# Patient Record
Sex: Male | Born: 1970 | Race: White | Hispanic: No | Marital: Single | Smoking: Current every day smoker
Health system: Southern US, Community
[De-identification: ages and names within clinical notes are randomized; demographics above are authoritative.]

## PROBLEM LIST (undated history)

## (undated) DIAGNOSIS — I10 Essential (primary) hypertension: Secondary | ICD-10-CM

## (undated) DIAGNOSIS — K219 Gastro-esophageal reflux disease without esophagitis: Secondary | ICD-10-CM

## (undated) DIAGNOSIS — R51 Headache: Secondary | ICD-10-CM

## (undated) DIAGNOSIS — F419 Anxiety disorder, unspecified: Secondary | ICD-10-CM

## (undated) DIAGNOSIS — M1990 Unspecified osteoarthritis, unspecified site: Secondary | ICD-10-CM

## (undated) DIAGNOSIS — J189 Pneumonia, unspecified organism: Secondary | ICD-10-CM

## (undated) HISTORY — PX: HERNIA REPAIR: SHX51

---

## 1999-09-25 ENCOUNTER — Emergency Department (HOSPITAL_COMMUNITY): Admission: EM | Admit: 1999-09-25 | Discharge: 1999-09-25 | Payer: Self-pay | Admitting: Emergency Medicine

## 1999-10-17 ENCOUNTER — Ambulatory Visit (HOSPITAL_COMMUNITY): Admission: RE | Admit: 1999-10-17 | Discharge: 1999-10-17 | Payer: Self-pay | Admitting: General Surgery

## 2000-08-20 ENCOUNTER — Emergency Department (HOSPITAL_COMMUNITY): Admission: EM | Admit: 2000-08-20 | Discharge: 2000-08-20 | Payer: Self-pay | Admitting: Emergency Medicine

## 2007-08-09 ENCOUNTER — Emergency Department (HOSPITAL_COMMUNITY): Admission: EM | Admit: 2007-08-09 | Discharge: 2007-08-09 | Payer: Self-pay | Admitting: Emergency Medicine

## 2009-08-17 ENCOUNTER — Emergency Department (HOSPITAL_BASED_OUTPATIENT_CLINIC_OR_DEPARTMENT_OTHER): Admission: EM | Admit: 2009-08-17 | Discharge: 2009-08-17 | Payer: Self-pay | Admitting: Emergency Medicine

## 2009-08-29 ENCOUNTER — Emergency Department (HOSPITAL_COMMUNITY): Admission: EM | Admit: 2009-08-29 | Discharge: 2009-08-29 | Payer: Self-pay | Admitting: Emergency Medicine

## 2010-09-18 ENCOUNTER — Emergency Department (HOSPITAL_COMMUNITY)
Admission: EM | Admit: 2010-09-18 | Discharge: 2010-09-18 | Disposition: A | Discharge status: Home / Self Care | Payer: Self-pay | Attending: Emergency Medicine | Admitting: Emergency Medicine

## 2010-09-18 ENCOUNTER — Emergency Department (HOSPITAL_COMMUNITY): Payer: Self-pay

## 2010-09-18 DIAGNOSIS — M25519 Pain in unspecified shoulder: Secondary | ICD-10-CM | POA: Insufficient documentation

## 2010-10-05 ENCOUNTER — Emergency Department (HOSPITAL_COMMUNITY)
Admission: EM | Admit: 2010-10-05 | Discharge: 2010-10-05 | Disposition: A | Discharge status: Home / Self Care | Payer: Self-pay | Attending: Emergency Medicine | Admitting: Emergency Medicine

## 2010-10-05 DIAGNOSIS — K029 Dental caries, unspecified: Secondary | ICD-10-CM | POA: Insufficient documentation

## 2010-10-05 DIAGNOSIS — K089 Disorder of teeth and supporting structures, unspecified: Secondary | ICD-10-CM | POA: Insufficient documentation

## 2010-10-05 DIAGNOSIS — K047 Periapical abscess without sinus: Secondary | ICD-10-CM | POA: Insufficient documentation

## 2010-10-15 ENCOUNTER — Emergency Department (HOSPITAL_COMMUNITY)
Admission: EM | Admit: 2010-10-15 | Discharge: 2010-10-15 | Disposition: A | Discharge status: Home / Self Care | Payer: No Typology Code available for payment source | Attending: Emergency Medicine | Admitting: Emergency Medicine

## 2010-10-15 ENCOUNTER — Emergency Department (HOSPITAL_COMMUNITY): Payer: No Typology Code available for payment source

## 2010-10-15 ENCOUNTER — Emergency Department (HOSPITAL_COMMUNITY)
Admission: EM | Admit: 2010-10-15 | Discharge: 2010-10-15 | Discharge status: Against Medical Advice | Payer: No Typology Code available for payment source | Attending: Emergency Medicine | Admitting: Emergency Medicine

## 2010-10-15 DIAGNOSIS — IMO0002 Reserved for concepts with insufficient information to code with codable children: Secondary | ICD-10-CM | POA: Insufficient documentation

## 2010-10-15 DIAGNOSIS — S0993XA Unspecified injury of face, initial encounter: Secondary | ICD-10-CM | POA: Insufficient documentation

## 2010-10-15 DIAGNOSIS — Y92410 Unspecified street and highway as the place of occurrence of the external cause: Secondary | ICD-10-CM | POA: Insufficient documentation

## 2010-10-15 DIAGNOSIS — S4980XA Other specified injuries of shoulder and upper arm, unspecified arm, initial encounter: Secondary | ICD-10-CM | POA: Insufficient documentation

## 2010-10-15 DIAGNOSIS — M25519 Pain in unspecified shoulder: Secondary | ICD-10-CM | POA: Insufficient documentation

## 2010-10-15 DIAGNOSIS — S46909A Unspecified injury of unspecified muscle, fascia and tendon at shoulder and upper arm level, unspecified arm, initial encounter: Secondary | ICD-10-CM | POA: Insufficient documentation

## 2010-10-15 DIAGNOSIS — S199XXA Unspecified injury of neck, initial encounter: Secondary | ICD-10-CM | POA: Insufficient documentation

## 2010-10-15 DIAGNOSIS — M542 Cervicalgia: Secondary | ICD-10-CM | POA: Insufficient documentation

## 2011-04-25 ENCOUNTER — Encounter (HOSPITAL_COMMUNITY): Payer: Self-pay | Admitting: Pharmacy Technician

## 2011-04-27 ENCOUNTER — Encounter (HOSPITAL_COMMUNITY): Payer: Self-pay

## 2011-04-27 ENCOUNTER — Encounter (HOSPITAL_COMMUNITY)
Admission: RE | Admit: 2011-04-27 | Discharge: 2011-04-27 | Disposition: A | Discharge status: Home / Self Care | Payer: Medicaid Other | Source: Ambulatory Visit | Attending: Oral Surgery | Admitting: Oral Surgery

## 2011-04-27 HISTORY — DX: Unspecified osteoarthritis, unspecified site: M19.90

## 2011-04-27 HISTORY — DX: Essential (primary) hypertension: I10

## 2011-04-27 HISTORY — DX: Anxiety disorder, unspecified: F41.9

## 2011-04-27 HISTORY — DX: Pneumonia, unspecified organism: J18.9

## 2011-04-27 HISTORY — DX: Headache: R51

## 2011-04-27 HISTORY — DX: Gastro-esophageal reflux disease without esophagitis: K21.9

## 2011-04-27 LAB — BASIC METABOLIC PANEL
GFR calc Af Amer: >90 mL/min (ref >90)
GFR calc non Af Amer: >90 mL/min (ref >90)
Glucose, Bld: 119 mg/dL — ABNORMAL HIGH (ref 70–99)
Potassium: 4.7 mEq/L (ref 3.5–5.1)
Sodium: 142 mEq/L (ref 135–145)

## 2011-04-27 LAB — CBC
Hemoglobin: 14.5 g/dL (ref 13.0–17.0)
MCH: 31.9 pg (ref 26.0–34.0)
RBC: 4.55 MIL/uL (ref 4.22–5.81)
WBC: 8.4 10*3/uL (ref 4.0–10.5)

## 2011-04-27 NOTE — Pre-Procedure Instructions (Signed)
20 Eddie Simpson  04/27/2011   Your procedure is scheduled on:  05/11/2011, Friday  Report to [facility] at 6:45a.m.  Call this number if you have problems the morning of surgery: [phone]   Remember:   Do not eat food:After Midnight.  Do not drink clear liquids: 4 Hours before arrival.  Take these medicines the morning of surgery with A SIP OF WATER: xanax- if desired   Do not wear jewelry, make-up or nail polish.  Do not wear lotions, powders, or perfumes. You may wear deodorant.  Do not shave 48 hours prior to surgery.  Do not bring valuables to the hospital.  Contacts, dentures or bridgework may not be worn into surgery.  Leave suitcase in the car. After surgery it may be brought to your room.  For patients admitted to the hospital, checkout time is 11:00 AM the day of discharge.   Patients discharged the day of surgery will not be allowed to drive home.  Name and phone number of your driver:Elena Lorin Picket, ph: [phone]  Special Instructions: CHG Shower Use Special Wash: 1/2 bottle night before surgery and 1/2 bottle morning of surgery.   Please read over the following fact sheets that you were given: Pain Booklet, Coughing and Deep Breathing, MRSA Information and Surgical Site Infection Prevention

## 2011-05-10 NOTE — H&P (Signed)
Eddie Simpson is a 40 y.o. male patient with [id number] impacted wisdom teeth.  No diagnosis found.  Past Medical History  Diagnosis Date  . Hypertension     no Rx yet, but pt. remarks that his BP is sometimes ^   . Pneumonia     as a child  . GERD (gastroesophageal reflux disease)     rare use of acid reflux  . Headache     been told that he has headaches relative to  sinus problem relative to teeth  malposition    . 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   . Anxiety     seen by Dr. Tiburcio Pea, Psych., started xanax for anxiety, was also given Cymbalta & he didn't like the drug's effect     No current facility-administered medications for this encounter.   Current Outpatient Prescriptions  Medication Sig Dispense Refill  . ALPRAZolam (XANAX) 1 MG tablet Take 1 mg by mouth at bedtime as needed. For anxiety or sleep       . Aspirin-Salicylamide-Caffeine (BC HEADACHE POWDER PO) Take 1 Package by mouth daily as needed. For headache        . Chlorpheniramine-PSE-Ibuprofen (ADVIL ALLERGY SINUS) 2-30-200 MG TABS Take 1 tablet by mouth daily as needed. For allergy or sinus congestion       . diphenhydramine-acetaminophen (TYLENOL PM) 25-500 MG TABS Take 1 tablet by mouth at bedtime as needed. For sleep       . pseudoephedrine-acetaminophen (TYLENOL SINUS) 30-500 MG TABS Take 1 tablet by mouth every 4 (four) hours as needed. For allergies        No Known Allergies Active Problems:  * No active hospital problems. *   Vitals: There were no vitals taken for this visit. Lab results:No results found for this or any previous visit (from the past 24 hour(s)). Radiology Results: No results found. General appearance: alert and no distress Head: atraumatic Eyes: conjunctivae/corneas clear. PERRL, EOM's intact. Fundi benign. Ears: normal TM's and external ear canals both ears Nose: Nares normal. Septum midline. Mucosa normal. No drainage or sinus tenderness. Throat: abnormal  findings: Impacted teeth #'s 1, 16, 17, and 32 and Nonrestorable teeth #'s 18 and 31 Neck: no adenopathy and supple, symmetrical, trachea midline Resp: clear to auscultation bilaterally Cardio: regular rate and rhythm, S1, S2 normal, no murmur, click, rub or gallop GI: soft, non-tender; bowel sounds normal; no masses,  no organomegaly Extremities: extremities normal, atraumatic, no cyanosis or edema Neurologic: Alert and oriented X 3, normal strength and tone. Normal symmetric reflexes. Normal coordination and gait  Assessment:40 yo WM with impacted/nonrestorable teeth #'s 1, 16, 17, 18, 31, and 32.  Plan: Extract teeth 3's 1, 16, 17, 18, 31, 32. General anesthesia. Same day surgery   Glen Blatchley M 05/10/2011

## 2011-05-11 ENCOUNTER — Encounter (HOSPITAL_COMMUNITY)
Admission: RE | Disposition: A | Discharge status: Home / Self Care | Payer: Self-pay | Source: Ambulatory Visit | Attending: Oral Surgery

## 2011-05-11 ENCOUNTER — Encounter (HOSPITAL_COMMUNITY): Payer: Self-pay | Admitting: Anesthesiology

## 2011-05-11 ENCOUNTER — Ambulatory Visit (HOSPITAL_COMMUNITY): Payer: Medicaid Other | Admitting: Anesthesiology

## 2011-05-11 ENCOUNTER — Ambulatory Visit (HOSPITAL_COMMUNITY)
Admission: RE | Admit: 2011-05-11 | Discharge: 2011-05-11 | Disposition: A | Discharge status: Home / Self Care | Payer: Medicaid Other | Source: Ambulatory Visit | Attending: Oral Surgery | Admitting: Oral Surgery

## 2011-05-11 DIAGNOSIS — K006 Disturbances in tooth eruption: Secondary | ICD-10-CM | POA: Insufficient documentation

## 2011-05-11 DIAGNOSIS — Z01812 Encounter for preprocedural laboratory examination: Secondary | ICD-10-CM | POA: Insufficient documentation

## 2011-05-11 DIAGNOSIS — I10 Essential (primary) hypertension: Secondary | ICD-10-CM | POA: Insufficient documentation

## 2011-05-11 DIAGNOSIS — K219 Gastro-esophageal reflux disease without esophagitis: Secondary | ICD-10-CM | POA: Insufficient documentation

## 2011-05-11 DIAGNOSIS — F17200 Nicotine dependence, unspecified, uncomplicated: Secondary | ICD-10-CM | POA: Insufficient documentation

## 2011-05-11 DIAGNOSIS — K029 Dental caries, unspecified: Secondary | ICD-10-CM | POA: Insufficient documentation

## 2011-05-11 DIAGNOSIS — K011 Impacted teeth: Secondary | ICD-10-CM

## 2011-05-11 HISTORY — PX: TOOTH EXTRACTION: SHX859

## 2011-05-11 SURGERY — DENTAL RESTORATION/EXTRACTIONS
Anesthesia: General | Site: Mouth | Wound class: Clean Contaminated

## 2011-05-11 MED ORDER — SODIUM CHLORIDE 0.9 % IR SOLN
Status: DC | PRN
  Administered 2011-05-11: 1

## 2011-05-11 MED ORDER — GLYCOPYRROLATE 0.2 MG/ML IJ SOLN
INTRAMUSCULAR | Status: DC | PRN
  Administered 2011-05-11: .8 mg via INTRAVENOUS

## 2011-05-11 MED ORDER — SODIUM CHLORIDE 0.9 % EX SOLN: CUTANEOUS | Status: DC

## 2011-05-11 MED ORDER — NEOSTIGMINE METHYLSULFATE 1 MG/ML IJ SOLN
INTRAMUSCULAR | Status: DC | PRN
  Administered 2011-05-11: 5 mg via INTRAVENOUS

## 2011-05-11 MED ORDER — FENTANYL CITRATE 0.05 MG/ML IJ SOLN
INTRAMUSCULAR | Status: DC | PRN
  Administered 2011-05-11: 50 ug via INTRAVENOUS
  Administered 2011-05-11: 100 ug via INTRAVENOUS
  Administered 2011-05-11: 50 ug via INTRAVENOUS

## 2011-05-11 MED ORDER — HYDROMORPHONE HCL PF 1 MG/ML IJ SOLN
0.2500 mg | INTRAMUSCULAR | Status: DC | PRN
  Administered 2011-05-11 (×2): 0.5 mg via INTRAVENOUS

## 2011-05-11 MED ORDER — OXYCODONE-ACETAMINOPHEN 10-325 MG PO TABS: 1.0000 | ORAL_TABLET | ORAL | Status: AC | PRN

## 2011-05-11 MED ORDER — CEFAZOLIN SODIUM 1-5 GM-% IV SOLN
INTRAVENOUS | Status: DC | PRN
  Administered 2011-05-11: 1 g via INTRAVENOUS

## 2011-05-11 MED ORDER — LORAZEPAM 2 MG/ML IJ SOLN: 1.0000 mg | Freq: Once | INTRAMUSCULAR | Status: DC | PRN

## 2011-05-11 MED ORDER — PROPOFOL 10 MG/ML IV EMUL
INTRAVENOUS | Status: DC | PRN
  Administered 2011-05-11: 280 mg via INTRAVENOUS

## 2011-05-11 MED ORDER — CEFAZOLIN SODIUM 1-5 GM-% IV SOLN
INTRAVENOUS | Status: AC
  Filled 2011-05-11: qty 50

## 2011-05-11 MED ORDER — LIDOCAINE-EPINEPHRINE 2 %-1:100000 IJ SOLN
INTRAMUSCULAR | Status: DC | PRN
  Administered 2011-05-11: 10 mL

## 2011-05-11 MED ORDER — ROCURONIUM BROMIDE 100 MG/10ML IV SOLN
INTRAVENOUS | Status: DC | PRN
  Administered 2011-05-11: 40 mg via INTRAVENOUS

## 2011-05-11 MED ORDER — SODIUM CHLORIDE 0.9 % IV SOLN
INTRAVENOUS | Status: DC | PRN
  Administered 2011-05-11: 08:00:00 via INTRAVENOUS

## 2011-05-11 MED ORDER — MIDAZOLAM HCL 5 MG/5ML IJ SOLN
INTRAMUSCULAR | Status: DC | PRN
  Administered 2011-05-11: 2 mg via INTRAVENOUS

## 2011-05-11 MED ORDER — SODIUM CHLORIDE 0.9 % IJ SOLN
INTRAMUSCULAR | Status: AC
  Filled 2011-05-11: qty 3

## 2011-05-11 MED ORDER — ONDANSETRON HCL 4 MG/2ML IJ SOLN
INTRAMUSCULAR | Status: DC | PRN
  Administered 2011-05-11: 4 mg via INTRAVENOUS

## 2011-05-11 MED ORDER — PROMETHAZINE HCL 25 MG/ML IJ SOLN: 6.2500 mg | INTRAMUSCULAR | Status: DC | PRN

## 2011-05-11 SURGICAL SUPPLY — 32 items
BLADE SURG 15 STRL LF DISP TIS (BLADE) ×1 IMPLANT
BLADE SURG 15 STRL SS (BLADE) ×1
BUR CROSS CUT (BURR)
BUR CROSS CUT FISSURE 1.6 (BURR) ×2 IMPLANT
BUR EGG ELITE 4.0 (BURR) IMPLANT
BUR SRG MED 1.6XXCUT FSSR (BURR) IMPLANT
BUR SURG 4X8 MED (BURR) IMPLANT
BURR SRG MED 1.6XXCUT FSSR (BURR)
BURR SURG 4X8 MED (BURR)
CANISTER SUCTION 2500CC (MISCELLANEOUS) ×2 IMPLANT
CLOTH BEACON ORANGE TIMEOUT ST (SAFETY) ×2 IMPLANT
COVER SURGICAL LIGHT HANDLE (MISCELLANEOUS) ×2 IMPLANT
GAUZE PACKING FOLDED 2  STR (GAUZE/BANDAGES/DRESSINGS) ×1
GAUZE PACKING FOLDED 2 STR (GAUZE/BANDAGES/DRESSINGS) ×1 IMPLANT
GAUZE SPONGE 4X4 16PLY XRAY LF (GAUZE/BANDAGES/DRESSINGS) IMPLANT
GLOVE BIO SURGEON STRL SZ 6.5 (GLOVE) ×6 IMPLANT
GLOVE BIO SURGEON STRL SZ7.5 (GLOVE) ×4 IMPLANT
GOWN STRL NON-REIN LRG LVL3 (GOWN DISPOSABLE) ×4 IMPLANT
KIT BASIN OR (CUSTOM PROCEDURE TRAY) ×2 IMPLANT
KIT ROOM TURNOVER OR (KITS) ×2 IMPLANT
NEEDLE 22X1 1/2 (OR ONLY) (NEEDLE) ×2 IMPLANT
NEEDLE BLUNT 16X1.5 OR ONLY (NEEDLE) IMPLANT
NS IRRIG 1000ML POUR BTL (IV SOLUTION) ×2 IMPLANT
PAD ARMBOARD 7.5X6 YLW CONV (MISCELLANEOUS) ×4 IMPLANT
SUT CHROMIC 3 0 PS 2 (SUTURE) ×4 IMPLANT
SYR 50ML SLIP (SYRINGE) IMPLANT
TOWEL OR 17X24 6PK STRL BLUE (TOWEL DISPOSABLE) ×2 IMPLANT
TOWEL OR 17X26 10 PK STRL BLUE (TOWEL DISPOSABLE) ×2 IMPLANT
TRAY ENT MC OR (CUSTOM PROCEDURE TRAY) ×2 IMPLANT
TUBING IRRIGATION (MISCELLANEOUS) ×2 IMPLANT
WATER STERILE IRR 1000ML POUR (IV SOLUTION) IMPLANT
YANKAUER SUCT BULB TIP NO VENT (SUCTIONS) ×2 IMPLANT

## 2011-05-11 NOTE — Op Note (Signed)
05/11/2011  9:42 AM  PATIENT:  Eddie Simpson  40 y.o. male  PRE-OPERATIVE DIAGNOSIS: Impacted teeth #'s 1, 16, 17, 32,  non restorable teeth #'s  18, 31   POST-OPERATIVE DIAGNOSIS:  Same  PROCEDURE:  Procedure(s): Extraction of teeth #'s 1, 16, 17, 18, 31, 32 DENTAL RESTORATION/EXTRACTIONS  SURGEON:  Surgeon(s): Georgia Lopes  ANESTHESIA:   local and general  EBL:  minimal  DRAINS: none   LOCAL MEDICATIONS USED:  LIDOCAINE 14 CC  SPECIMEN:  No Specimen  COUNTS:  YES  PLAN OF CARE: Discharge to home after PACU  PATIENT DISPOSITION:  PACU - hemodynamically stable.   PROCEDURE DETAILS: Dictation # [id number]  Georgia Lopes, DMD 05/11/2011 9:42 AM

## 2011-05-11 NOTE — Anesthesia Preprocedure Evaluation (Addendum)
Anesthesia Evaluation  Patient identified by MRN, date of birth, ID band Patient awake    Reviewed: Allergy & Precautions, H&P , NPO status , Patient's Chart, lab work & pertinent test results  Airway Mallampati: II TM Distance: >3 FB Neck ROM: Full    Dental  (+) Teeth Intact and Dental Advisory Given   Pulmonary pneumonia , Current Smoker,    Pulmonary exam normal       Cardiovascular hypertension,     Neuro/Psych  Headaches,    GI/Hepatic GERD-  ,  Endo/Other    Renal/GU      Musculoskeletal   Abdominal   Peds  Hematology   Anesthesia Other Findings   Reproductive/Obstetrics                          Anesthesia Physical Anesthesia Plan  ASA: II  Anesthesia Plan: General   Post-op Pain Management:    Induction: Intravenous  Airway Management Planned: Oral ETT  Additional Equipment:   Intra-op Plan:   Post-operative Plan: Extubation in OR  Informed Consent: I have reviewed the patients History and Physical, chart, labs and discussed the procedure including the risks, benefits and alternatives for the proposed anesthesia with the patient or authorized representative who has indicated his/her understanding and acceptance.     Plan Discussed with: CRNA and Surgeon  Anesthesia Plan Comments:         Anesthesia Quick Evaluation

## 2011-05-11 NOTE — Transfer of Care (Signed)
Immediate Anesthesia Transfer of Care Note  Patient: Eddie Simpson  Procedure(s) Performed:  DENTAL RESTORATION/EXTRACTIONS - Multible Extractions   Patient Location: PACU  Anesthesia Type: General  Level of Consciousness: awake, alert  and oriented  Airway & Oxygen Therapy: Patient Spontanous Breathing and Patient connected to nasal cannula oxygen  Post-op Assessment: Report given to PACU RN  Post vital signs: Reviewed and stable  Complications: No apparent anesthesia complications

## 2011-05-11 NOTE — Anesthesia Postprocedure Evaluation (Signed)
  Anesthesia Post-op Note  Patient: Eddie Simpson  Procedure(s) Performed:  DENTAL RESTORATION/EXTRACTIONS - Multible Extractions   Patient Location: PACU  Anesthesia Type: General  Level of Consciousness: awake, alert  and oriented  Airway and Oxygen Therapy: Patient Spontanous Breathing  Post-op Pain: mild  Post-op Assessment: Post-op Vital signs reviewed, Patient's Cardiovascular Status Stable, Respiratory Function Stable, Patent Airway and No signs of Nausea or vomiting  Post-op Vital Signs: Reviewed  Complications: No apparent anesthesia complications

## 2011-05-11 NOTE — Preoperative (Signed)
Beta Blockers   Reason not to administer Beta Blockers:Not Applicable 

## 2011-05-11 NOTE — Op Note (Signed)
Eddie Simpson, Eddie Simpson NO.:  [id number]  MEDICAL RECORD NO.:  [id number]  LOCATION:  [facility]                         FACILITY:  [facility]  PHYSICIAN:  Georgia Lopes, M.D.  DATE OF BIRTH:  Jun 27, 1970  DATE OF PROCEDURE:  05/11/2011 DATE OF DISCHARGE:  05/11/2011                              OPERATIVE REPORT   PREOPERATIVE DIAGNOSES: 1. Impacted teeth numbers 1, 16, 17, 32. 2. Nonrestorable teeth numbers 18 and 31.  POSTOPERATIVE DIAGNOSES: 1. Impacted teeth numbers 1, 16, 17, 32. 2. Nonrestorable teeth numbers 18 and 31.  PROCEDURE:  Removal of teeth numbers 1, 16, 17, 18, 31, 32.  SURGEON:  Georgia Lopes, M.D.  ANESTHESIA:  General, Dr. Gypsy Balsam is attending, oral intubation.  INDICATIONS FOR PROCEDURE:  Eddie Simpson is a 40 year old male who is referred to me by his general dentist for removal of multiple teeth.  The teeth numbers 1 and 16 were completely impacted and imbedded in the bone. Lower teeth appear to have an elongated roots and dense cortical bone. Because of the difficulty anticipated in the procedures, it was recommended the patient to have general anesthesia with airway protection via endotracheal intubation for the surgery.  PROCEDURE:  The patient was taken to the operating room, placed on the table in a supine position.  General anesthesia was administered intravenously and an oral endotracheal tube was placed and marked.  The eyes were protected.  The patient was draped for the procedure.  The posterior pharynx was suctioned.  Throat pack was placed.  2% lidocaine 1:100,000 epinephrine was infiltrated in inferior-alveolar block and a buccal and palatal infiltration in the maxilla.  A total of 14 mL was utilized.  A bite block and a Sweetheart were placed on the right side of the mouth and attention was turned to the left side.  A #15 blade was used to make a full-thickness incision around teeth numbers 17 and 18, and an envelope flap was created  over tooth number 16.  The periosteum was reflected around all of these teeth using a periosteal elevator. Then bone was removed around teeth numbers 17, 18 and 16 with the Summit handpiece under irrigation.  Then, the teeth were elevated with a 301 elevator.  Tooth number 18 was removed from the mouth with the Kelman forceps.  Tooth number 17 was removed with the #151 forceps and tooth number 16 was removed with the 301 elevator.  The sockets were then curetted and irrigated and closed with 3-0 chromic.  The bite block and Sweetheart were repositioned and a #15 blade was used to make a full- thickness incision around teeth numbers 31 and 32, and an envelope flap was created overlying tooth number 1.  The periosteum was reflected with a periosteal elevator, and a proximal and circumferential bone was removed around teeth numbers 31 and 32.  Bone was removed from tooth number 1 with the Summit handpiece under irrigation.  Then, the teeth were elevated with a 301 elevator.  The lower teeth removed with a #151 forceps and the upper tooth was removed with the 301 elevator.  Tooth number 31 fractured during removal and the root was removed with a 301 elevator.  Then the sockets were curetted and irrigated and closed with 3-0 chromic.  The oral cavity was then inspected and there was found to be a fracture of the distal incisal edge of tooth number 7.  There was a large mesial decay and the enamel had been weakened by the decay.  This portion of the tooth was fractured as the Sweetheart retractor was removed from the mouth.  The sharp edges that were left were smoothed with the Stryker handpiece.  Then, the oral cavity was irrigated and suctioned.  Throat pack was removed.  The patient was awakened and taken to the recovery room, breathing spontaneously, in good condition.  ESTIMATED BLOOD LOSS:  Minimum.  COMPLICATIONS:  Fracture of incisal edge, tooth number 7.  Counts were  correct.     Georgia Lopes, M.D.     SMJ/MEDQ  D:  05/11/2011  T:  05/11/2011  Job:  [id number]

## 2011-05-14 ENCOUNTER — Encounter (HOSPITAL_COMMUNITY): Payer: Self-pay | Admitting: Oral Surgery

## 2012-05-29 ENCOUNTER — Emergency Department (HOSPITAL_COMMUNITY)
Admission: EM | Admit: 2012-05-29 | Discharge: 2012-05-29 | Disposition: A | Discharge status: Home / Self Care | Payer: Self-pay | Attending: Emergency Medicine | Admitting: Emergency Medicine

## 2012-05-29 ENCOUNTER — Emergency Department (HOSPITAL_COMMUNITY): Payer: Self-pay

## 2012-05-29 ENCOUNTER — Encounter (HOSPITAL_COMMUNITY): Payer: Self-pay

## 2012-05-29 DIAGNOSIS — Z8659 Personal history of other mental and behavioral disorders: Secondary | ICD-10-CM | POA: Insufficient documentation

## 2012-05-29 DIAGNOSIS — Z8701 Personal history of pneumonia (recurrent): Secondary | ICD-10-CM | POA: Insufficient documentation

## 2012-05-29 DIAGNOSIS — M25519 Pain in unspecified shoulder: Secondary | ICD-10-CM | POA: Insufficient documentation

## 2012-05-29 DIAGNOSIS — R059 Cough, unspecified: Secondary | ICD-10-CM | POA: Insufficient documentation

## 2012-05-29 DIAGNOSIS — Z79899 Other long term (current) drug therapy: Secondary | ICD-10-CM | POA: Insufficient documentation

## 2012-05-29 DIAGNOSIS — Z8739 Personal history of other diseases of the musculoskeletal system and connective tissue: Secondary | ICD-10-CM | POA: Insufficient documentation

## 2012-05-29 DIAGNOSIS — R05 Cough: Secondary | ICD-10-CM | POA: Insufficient documentation

## 2012-05-29 DIAGNOSIS — Z8719 Personal history of other diseases of the digestive system: Secondary | ICD-10-CM | POA: Insufficient documentation

## 2012-05-29 DIAGNOSIS — Z8679 Personal history of other diseases of the circulatory system: Secondary | ICD-10-CM | POA: Insufficient documentation

## 2012-05-29 DIAGNOSIS — F17200 Nicotine dependence, unspecified, uncomplicated: Secondary | ICD-10-CM | POA: Insufficient documentation

## 2012-05-29 MED ORDER — HYDROCODONE-ACETAMINOPHEN 5-325 MG PO TABS: 2.0000 | ORAL_TABLET | ORAL | Status: DC | PRN

## 2012-05-29 NOTE — Discharge Instructions (Signed)
Arthralgia Arthralgia is joint pain. A joint is a place where two bones meet. Joint pain can happen for many reasons. The joint can be bruised, stiff, infected, or weak from aging. Pain usually goes away after resting and taking medicine for soreness.  HOME CARE  Rest the joint as told by your doctor.  Keep the sore joint raised (elevated) for the first 24 hours.  Put ice on the joint area.  Put ice in a plastic bag.  Place a towel between your skin and the bag.  Leave the ice on for 15 to 20 minutes, 3 to 4 times a day.  Wear your splint, casting, elastic bandage, or sling as told by your doctor.  Only take medicine as told by your doctor. Do not take aspirin.  Use crutches as told by your doctor. Do not put weight on the joint until told to by your doctor. GET HELP RIGHT AWAY IF:   You have bruising, puffiness (swelling), or more pain.  Your fingers or toes turn blue or start to lose feeling (numb).  Your medicine does not lessen the pain.  Your pain becomes severe.  You have a temperature by mouth above 102 F (38.9 C), not controlled by medicine.  You cannot move or use the joint. MAKE SURE YOU:   Understand these instructions.  Will watch your condition.  Will get help right away if you are not doing well or get worse. Document Released: 05/09/2009 Document Revised: 08/13/2011 Document Reviewed: 05/09/2009 [facility] Patient Information 2013 [place], [place]. Rotator Cuff Injury The rotator cuff is the collective set of muscles and tendons that make up the stabilizing unit of your shoulder. This unit holds in the ball of the humerus (upper arm bone) in the socket of the scapula (shoulder blade). Injuries to this stabilizing unit most commonly come from sports or activities that cause the arm to be moved repeatedly over the head. Examples of this include throwing, weight lifting, swimming, racquet sports, or an injury such as falling on your arm. Chronic (longstanding)  irritation of this unit can cause inflammation (soreness), bursitis, and eventual damage to the tendons to the point of rupture (tear). An acute (sudden) injury of the rotator cuff can result in a partial or complete tear. You may need surgery with complete tears. Small or partial rotator cuff tears may be treated conservatively with temporary immobilization, exercises and rest. Physical therapy may be needed. HOME CARE INSTRUCTIONS   Apply ice to the injury for 15 to 20 minutes 3 to 4 times per day for the first 2 days. Put the ice in a plastic bag and place a towel between the bag of ice and your skin.  If you have a shoulder immobilizer (sling and straps), do not remove it for as long as directed by your caregiver or until you see a caregiver for a follow-up examination. If you need to remove it, move your arm as little as possible.  You may want to sleep on several pillows or in a recliner at night to lessen swelling and pain.  Only take over-the-counter or prescription medicines for pain, discomfort, or fever as directed by your caregiver.  Do simple hand squeezing exercises with a soft rubber ball to decrease hand swelling. SEEK MEDICAL CARE IF:   Pain in your shoulder increases or new pain or numbness develops in your arm, hand, or fingers.  Your hand or fingers are colder than your other hand. SEEK IMMEDIATE MEDICAL CARE IF:   Your  arm, hand, or fingers are numb or tingling.  Your arm, hand, or fingers are increasingly swollen and painful, or turn white or blue. Document Released: 05/18/2000 Document Revised: 08/13/2011 Document Reviewed: 05/11/2008 [facility] Patient Information 2013 [place], [place].

## 2012-05-29 NOTE — ED Notes (Signed)
Pt via EMS with complaints of left shoulder pain this morning at 0200. Pain increases with movement. Cough for 2 days non-productive.

## 2012-05-29 NOTE — ED Provider Notes (Signed)
History    This chart was scribed for Eddie Simpson, *, MD by Smitty Pluck, ED Scribe. The patient was seen in room APA07 and the patient's care was started at 1:42 PM.   CSN: [id number]  Arrival date & time 05/29/12  1155   None     Chief Complaint  Patient presents with  . Shoulder Pain    (Consider location/radiation/quality/duration/timing/severity/associated sxs/prior treatment) The history is provided by the patient. No language interpreter was used.   Eddie Simpson is a 41 y.o. male who presents to the Emergency Department complaining of constant, moderate left shoulder pain onset today 11 hours ago. Pt reports that he was awaken with shoulder pain. Applying pressure and movement aggravates the shoulder pain. Pt denies trauma to shoulder. He reports hx of right shoulder pain. He reports that he has non productive cough onset 2 days. Pt denies fever, chills, nausea, vomiting, rash, fall, and any other pain.   Past Medical History  Diagnosis Date  . Hypertension     no Rx yet, but pt. remarks that his BP is sometimes ^   . Pneumonia     as a child  . GERD (gastroesophageal reflux disease)     rare use of acid reflux  . Headache     been told that he has headaches relative to  sinus problem relative to teeth  malposition    . 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   . Anxiety     seen by Dr. Tiburcio Pea, Psych., started xanax for anxiety, was also given Cymbalta & he didn't like the drug's effect     Past Surgical History  Procedure Date  . Hernia repair     inguinal- 2006  . Tooth extraction 05/11/2011    Procedure: DENTAL RESTORATION/EXTRACTIONS;  Surgeon: Georgia Lopes;  Location: [facility] OR;  Service: Oral Surgery;  Laterality: N/A;  Multible Extractions     Family History  Problem Relation Age of Onset  . Anesthesia problems Neg Hx   . Hypotension Neg Hx   . Malignant hyperthermia Neg Hx   . Pseudochol deficiency Neg Hx     History    Substance Use Topics  . Smoking status: Current Every Day Smoker -- 0.5 packs/day for 10 years    Types: Cigarettes  . Smokeless tobacco: Not on file  . Alcohol Use: Yes     Comment: beer- "months ago"      Review of Systems  Constitutional: Negative for fever and chills.  Respiratory: Positive for cough. Negative for shortness of breath.   Cardiovascular: Negative for chest pain.  Gastrointestinal: Negative for nausea, vomiting and diarrhea.  Musculoskeletal: Positive for arthralgias. Negative for back pain.  Skin: Negative for rash.  All other systems reviewed and are negative.    Allergies  Ibuprofen  Home Medications   Current Outpatient Rx  Name  Route  Sig  Dispense  Refill  . ALPRAZOLAM 1 MG PO TABS   Oral   Take 1 mg by mouth at bedtime as needed. For anxiety or sleep          . BC HEADACHE POWDER PO   Oral   Take 1 Package by mouth daily as needed. For headache           . CHLORPHENIRAMINE-PSE-IBUPROFEN 2-30-200 MG PO TABS   Oral   Take 1 tablet by mouth daily as needed. For allergy or sinus congestion          .  DIPHENHYDRAMINE-APAP (SLEEP) 25-500 MG PO TABS   Oral   Take 1 tablet by mouth at bedtime as needed. For sleep          . PSEUDOEPHEDRINE-ACETAMINOPHEN 30-500 MG PO TABS   Oral   Take 1 tablet by mouth every 4 (four) hours as needed. For allergies          . SODIUM CHLORIDE 0.9 % EX SOLN      Begin salt water rinses the day after surgery. Rinse 3-4 times per day until you return for post-op visit.      0     BP 133/83  Pulse 90  Temp 97.9 F (36.6 C) (Oral)  Resp 16  Ht 5\' 11"  (1.803 m)  Wt 220 lb (99.791 kg)  BMI 30.68 kg/m2  SpO2 95%  Physical Exam  Nursing note and vitals reviewed. Constitutional: He appears well-developed and well-nourished. No distress.  HENT:  Head: Normocephalic and atraumatic.  Neck: Normal range of motion. Neck supple.  Cardiovascular: Normal rate, regular rhythm and normal heart sounds.    Musculoskeletal:       Left shoulder: He exhibits decreased range of motion, tenderness and pain. He exhibits no swelling and no deformity.  Skin: Skin is warm and dry.  Psychiatric: He has a normal mood and affect. His behavior is normal.    ED Course  Procedures (including critical care time) DIAGNOSTIC STUDIES: Oxygen Saturation is 95% on room air, normal by my interpretation.    COORDINATION OF CARE: 1:44 PM Discussed ED treatment with pt     Labs Reviewed - No data to display Dg Chest 2 View  05/29/2012  *RADIOLOGY REPORT*  Clinical Data: Chest tightness and cough.  Shoulder pain.  CHEST - 2 VIEW  Comparison: Chest x-ray 08/09/2007.  Findings: Lung volumes are normal.  No consolidative airspace disease.  No pleural effusions.  No pneumothorax.  No pulmonary nodule or mass noted.  Pulmonary vasculature and the cardiomediastinal silhouette are within normal limits.  IMPRESSION: 1. No radiographic evidence of acute cardiopulmonary disease.   Original Report Authenticated By: Trudie Reed, M.D.      1. Shoulder pain, acute       MDM  Patient presents to ER with complaints of left shoulder pain. Pain began overnight. He reports severe pain when he lies on his left side and shoulder area. Pain is sharp in nature it worsens with movement of the shoulder. He denies any direct injury. Examination is consistent with musculoskeletal pain. There is point tenderness in the area he indicates hurts. Pain is also reproduced with passive and active range of motion. There are no deformities noted. Patient does indicate that he has had a cough but no fever. Chest x-ray was clear, no sign of pneumonia or thoracic pathology. There is nothing in the history or examination that would suggest that this is cardiac in nature and does not warrant a cardiac workup. The shoulder will be treated with analgesia and rest.  Patient also asking about his chronic right shoulder pain. He has been told he has a  rotator cuff issue and right shoulder, but cannot afford an MRI or surgery. He was instructed that he should do range of motion and strengthening exercises for the right shoulder.     I personally performed the services described in this documentation, which was scribed in my presence. The recorded information has been reviewed and is accurate.    Eddie Crease, MD 05/29/12 1452

## 2013-02-13 IMAGING — CR DG CHEST 2V
2 series · 2 of 2 positions shown · non-contrast
Comparison: Chest x-ray 08/09/2007.

CLINICAL DATA: Chest tightness and cough.  Shoulder pain.

CHEST - 2 VIEW

[view not recorded (1 of 2)]
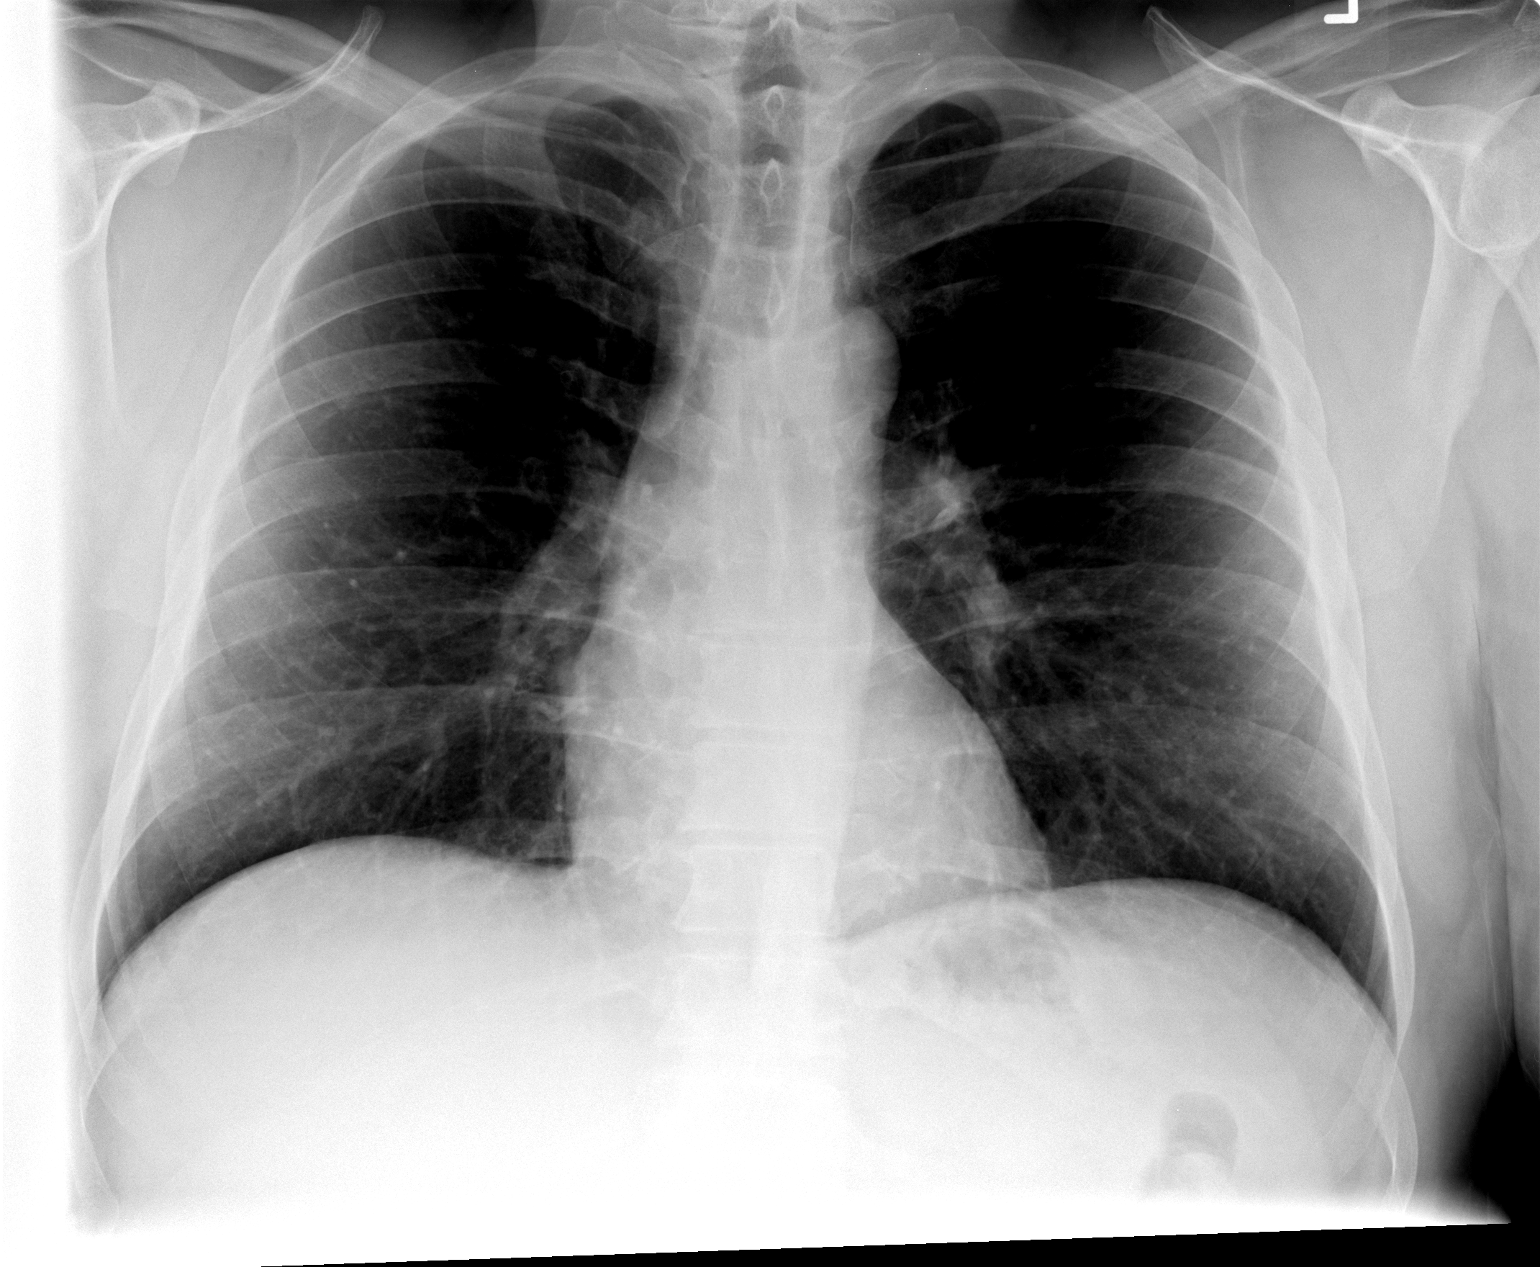

[view not recorded (2 of 2)]
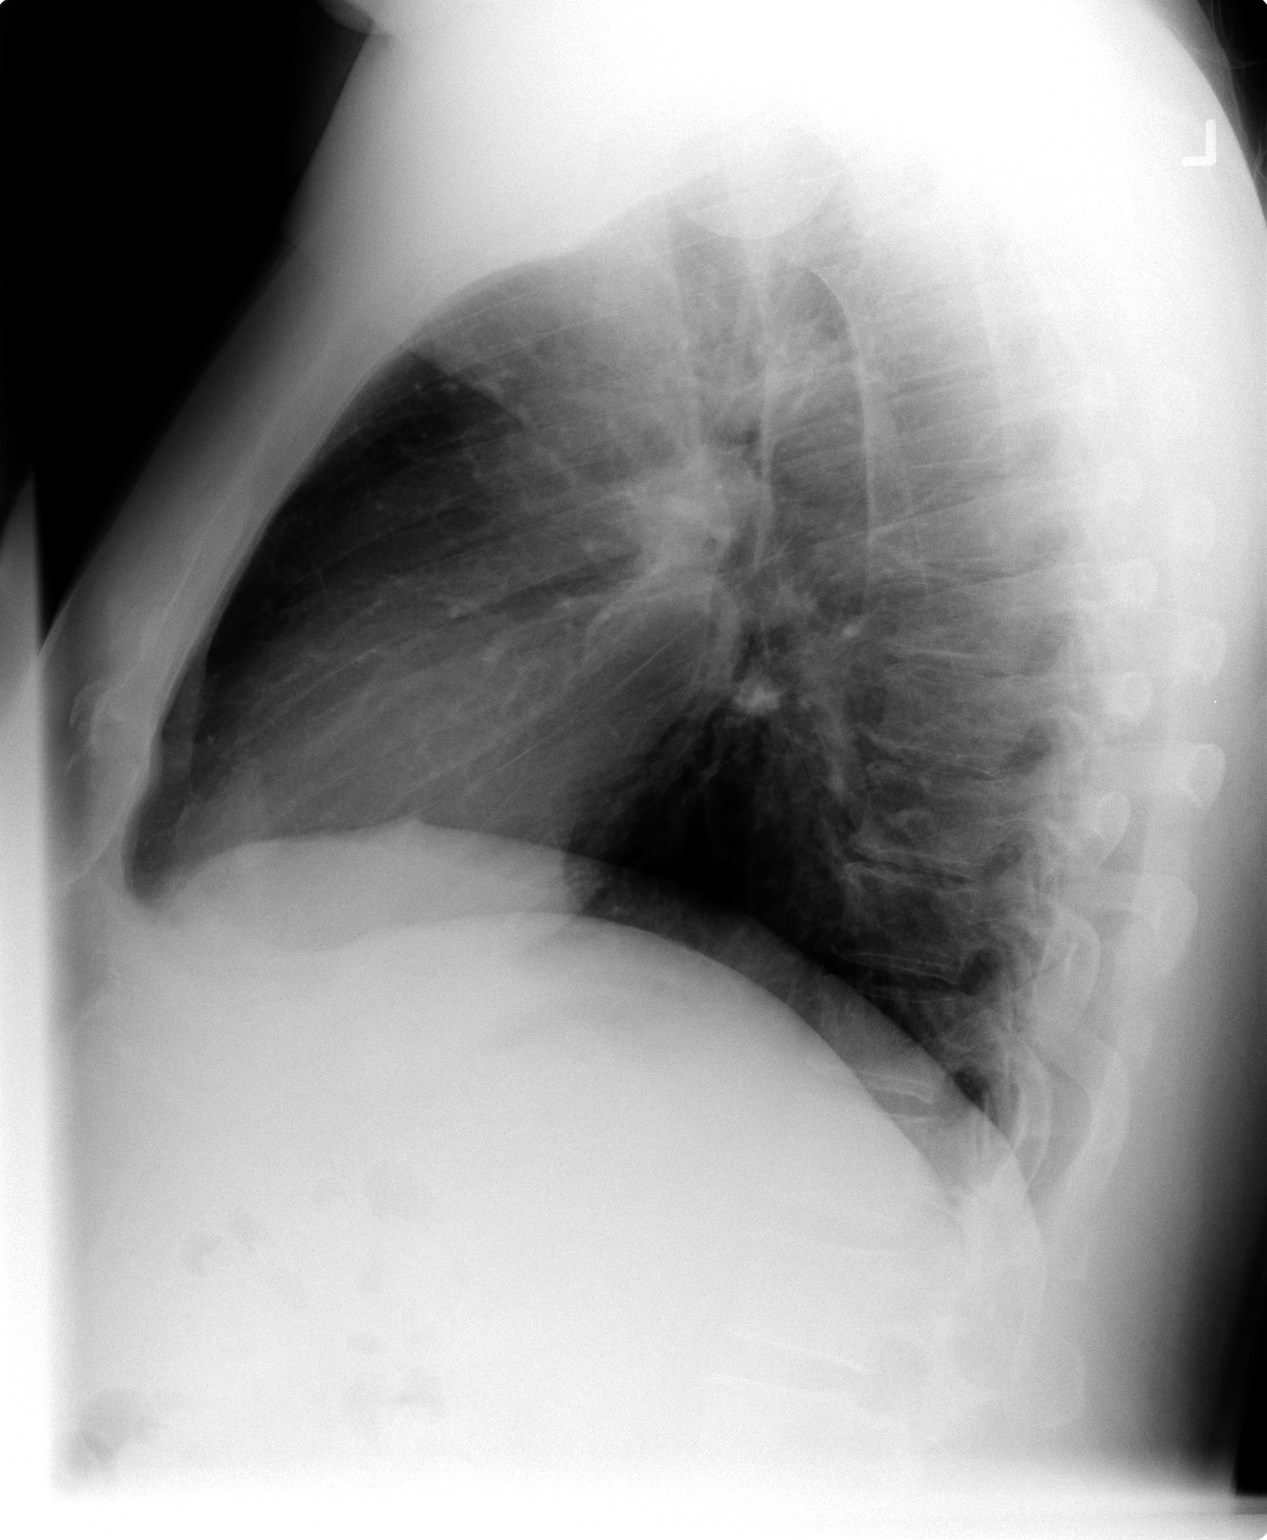

[2 of 2 positions shown; findings below may reference images not displayed]

FINDINGS: Lung volumes are normal.  No consolidative airspace
disease.  No pleural effusions.  No pneumothorax.  No pulmonary
nodule or mass noted.  Pulmonary vasculature and the
cardiomediastinal silhouette are within normal limits.
IMPRESSION: 1. No radiographic evidence of acute cardiopulmonary disease.

## 2014-11-11 ENCOUNTER — Emergency Department (HOSPITAL_COMMUNITY)
Admission: EM | Admit: 2014-11-11 | Discharge: 2014-11-11 | Disposition: A | Discharge status: Home / Self Care | Payer: Medicaid Other | Attending: Emergency Medicine | Admitting: Emergency Medicine

## 2014-11-11 ENCOUNTER — Encounter (HOSPITAL_COMMUNITY): Payer: Self-pay | Admitting: Emergency Medicine

## 2014-11-11 DIAGNOSIS — F419 Anxiety disorder, unspecified: Secondary | ICD-10-CM | POA: Insufficient documentation

## 2014-11-11 DIAGNOSIS — Z8739 Personal history of other diseases of the musculoskeletal system and connective tissue: Secondary | ICD-10-CM | POA: Insufficient documentation

## 2014-11-11 DIAGNOSIS — Z720 Tobacco use: Secondary | ICD-10-CM | POA: Insufficient documentation

## 2014-11-11 DIAGNOSIS — L259 Unspecified contact dermatitis, unspecified cause: Secondary | ICD-10-CM

## 2014-11-11 DIAGNOSIS — Z8701 Personal history of pneumonia (recurrent): Secondary | ICD-10-CM | POA: Insufficient documentation

## 2014-11-11 DIAGNOSIS — I10 Essential (primary) hypertension: Secondary | ICD-10-CM | POA: Insufficient documentation

## 2014-11-11 DIAGNOSIS — M1990 Unspecified osteoarthritis, unspecified site: Secondary | ICD-10-CM | POA: Insufficient documentation

## 2014-11-11 DIAGNOSIS — R110 Nausea: Secondary | ICD-10-CM | POA: Insufficient documentation

## 2014-11-11 MED ORDER — PREDNISONE 20 MG PO TABS: ORAL_TABLET | ORAL | Status: AC

## 2014-11-11 MED ORDER — DIPHENHYDRAMINE HCL 25 MG PO TABS: 25.0000 mg | ORAL_TABLET | Freq: Four times a day (QID) | ORAL | Status: AC | PRN

## 2014-11-11 NOTE — ED Provider Notes (Signed)
CSN: [id number]     Arrival date & time 11/11/14  1631 History  This chart was scribed for non-physician practitioner Fayrene Helper, PA, working with Eber Hong, MD, by [name], ED Scribe. This patient was seen in room WTR6/WTR6 and the patient's care was started at 5:42 PM.  Chief Complaint  Patient presents with  . Poison Ivy   The history is provided by the patient. No language interpreter was used.     HPI Comments: Eddie Simpson is a 44 y.o. male who presents to the Emergency Department complaining of diffuse rash to arms, legs, and periorbital region x 3 days. Pt notes that the rash began on his arms and has spread. He describes the rash as itching in sensation. Pt does believes he came into contact with poison ivy recently while doing yard work. He has tried Cologel and Aloe without relief. Pt also complains of fever, cold sweats, chills, and nausea. He took some Amoxicillin that was prescribed for his son despite having penicillin allergy hoping it would help. Pt does report having bad reaction to it afterwards. He also admits to applying sealant onto the arm due to itching so badly. This exacerbated the symptoms and pt immediately scrubbed it off. No eye involvement. No rash in mouth, palms of hands, and soles of feet. Denies chest pain, shortness of breath, visual changes, or any other symptoms. No new changes in medicine, soap, detergent.  No hx of diabetes.    Past Medical History  Diagnosis Date  . Hypertension     no Rx yet, but pt. remarks that his BP is sometimes ^   . Pneumonia     as a child  . GERD (gastroesophageal reflux disease)     rare use of acid reflux  . Headache(784.0)     been told that he has headaches relative to  sinus problem relative to teeth  malposition    . 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   . Anxiety     seen by Dr. Tiburcio Pea, Psych., started xanax for anxiety, was also given Cymbalta & he didn't like the drug's effect     Past Surgical History  Procedure Laterality Date  . Hernia repair      inguinal- 2006  . Tooth extraction  05/11/2011    Procedure: DENTAL RESTORATION/EXTRACTIONS;  Surgeon: Georgia Lopes;  Location: [facility] OR;  Service: Oral Surgery;  Laterality: N/A;  Multible Extractions    Family History  Problem Relation Age of Onset  . Anesthesia problems Neg Hx   . Hypotension Neg Hx   . Malignant hyperthermia Neg Hx   . Pseudochol deficiency Neg Hx    History  Substance Use Topics  . Smoking status: Current Every Day Smoker -- 0.50 packs/day for 10 years    Types: Cigarettes  . Smokeless tobacco: Not on file  . Alcohol Use: Yes     Comment: beer- "months ago"    Review of Systems  Constitutional: Positive for fever and chills.  Eyes: Negative for visual disturbance.  Respiratory: Negative for shortness of breath.   Cardiovascular: Negative for chest pain.  Gastrointestinal: Positive for nausea.  Skin: Positive for rash.      Allergies  Bee venom; Flexeril; and Ibuprofen  Home Medications   Prior to Admission medications   Medication Sig Start Date End Date Taking? Authorizing Provider  ALPRAZolam Prudy Feeler) 1 MG tablet Take 1 mg by mouth 3 (three) times daily as  needed. For anxiety or sleep    Historical Provider, MD  Aspirin-Salicylamide-Caffeine (BC HEADACHE POWDER PO) Take 1 Package by mouth every 6 (six) hours as needed. For headache     Historical Provider, MD  HYDROcodone-acetaminophen (NORCO/VICODIN) 5-325 MG per tablet Take 2 tablets by mouth every 4 (four) hours as needed for pain. 05/29/12   Gilda Crease, MD   Triage Vitals: BP 119/91 mmHg  Pulse 96  Temp(Src) 98.4 F (36.9 C) (Oral)  Resp 20  SpO2 99%   Physical Exam  Constitutional: He is oriented to person, place, and time. He appears well-developed and well-nourished. No distress.  HENT:  Head: Normocephalic and atraumatic.  Mouth/Throat: Oropharynx is clear and moist.  Eyes: Conjunctivae and EOM  are normal.  Neck: Neck supple. No tracheal deviation present.  Cardiovascular: Normal rate.   Pulmonary/Chest: Effort normal. No respiratory distress.  Abdominal: Soft. There is no tenderness.  Musculoskeletal: Normal range of motion.  Neurological: He is alert and oriented to person, place, and time.  Skin: Skin is warm and dry.  Vesicular region to bilateral arms, lower extremities, and right lower abdomen. Small involvement on face near right zygoamtic arch with no eye involvement.   No rash on palms of hands or soles of feet.   Psychiatric: He has a normal mood and affect. His behavior is normal.  Nursing note and vitals reviewed.   ED Course  Procedures (including critical care time)  DIAGNOSTIC STUDIES: Oxygen Saturation is 99% on RA, normal by my interpretation.    COORDINATION OF CARE: 5:46 PM-patient here with vesicular rash consistence with poison ivy contact dermatitis.  Discussed treatment plan which includes RX steroids with pt at bedside and pt agreed to plan. Advised pt to trim nails short so he does not scratch and spread rash. Pt can use oatmeal bath, calamine lotion and benadryl as needed.    Labs Review Labs Reviewed - No data to display  Imaging Review No results found.   EKG Interpretation None      MDM   Final diagnoses:  Contact dermatitis   BP 119/91 mmHg  Pulse 96  Temp(Src) 98.4 F (36.9 C) (Oral)  Resp 20  SpO2 99%   I personally performed the services described in this documentation, which was scribed in my presence. The recorded information has been reviewed and is accurate.       Fayrene Helper, PA-C 11/11/14 1754  Eber Hong, MD 11/12/14 [phone]

## 2014-11-11 NOTE — ED Notes (Signed)
Pt has a spread of poison ivy on arms, legs, lower abd.

## 2014-11-11 NOTE — Discharge Instructions (Signed)
Contact Dermatitis °Contact dermatitis is a rash that happens when something touches the skin. You touched something that irritates your skin, or you have allergies to something you touched. °HOME CARE  °· Avoid the thing that caused your rash. °· Keep your rash away from hot water, soap, sunlight, chemicals, and other things that might bother it. °· Do not scratch your rash. °· You can take cool baths to help stop itching. °· Only take medicine as told by your doctor. °· Keep all doctor visits as told. °GET HELP RIGHT AWAY IF:  °· Your rash is not better after 3 days. °· Your rash gets worse. °· Your rash is puffy (swollen), tender, red, sore, or warm. °· You have problems with your medicine. °MAKE SURE YOU:  °· Understand these instructions. °· Will watch your condition. °· Will get help right away if you are not doing well or get worse. °Document Released: 03/18/2009 Document Revised: 08/13/2011 Document Reviewed: 10/24/2010 °ExitCare® Patient Information ©2015 [employer]. This information is not intended to replace advice given to you by your health care provider. Make sure you discuss any questions you have with your health care provider. ° °Poison Ivy °Poison ivy is a inflammation of the skin (contact dermatitis) caused by touching the allergens on the leaves of the ivy plant following previous exposure to the plant. The rash usually appears 48 hours after exposure. The rash is usually bumps (papules) or blisters (vesicles) in a linear pattern. Depending on your own sensitivity, the rash may simply cause redness and itching, or it may also progress to blisters which may break open. These must be well cared for to prevent secondary bacterial (germ) infection, followed by scarring. Keep any open areas dry, clean, dressed, and covered with an antibacterial ointment if needed. The eyes may also get puffy. The puffiness is worst in the morning and gets better as the day progresses. This dermatitis usually heals  without scarring, within 2 to 3 weeks without treatment. °HOME CARE INSTRUCTIONS  °Thoroughly wash with soap and water as soon as you have been exposed to poison ivy. You have about one half hour to remove the plant resin before it will cause the rash. This washing will destroy the oil or antigen on the skin that is causing, or will cause, the rash. Be sure to wash under your fingernails as any plant resin there will continue to spread the rash. Do not rub skin vigorously when washing affected area. Poison ivy cannot spread if no oil from the plant remains on your body. A rash that has progressed to weeping sores will not spread the rash unless you have not washed thoroughly. It is also important to wash any clothes you have been wearing as these may carry active allergens. The rash will return if you wear the unwashed clothing, even several days later. °Avoidance of the plant in the future is the best measure. Poison ivy plant can be recognized by the number of leaves. Generally, poison ivy has three leaves with flowering branches on a single stem. °Diphenhydramine may be purchased over the counter and used as needed for itching. Do not drive with this medication if it makes you drowsy.Ask your caregiver about medication for children. °SEEK MEDICAL CARE IF: °· Open sores develop. °· Redness spreads beyond area of rash. °· You notice purulent (pus-like) discharge. °· You have increased pain. °· Other signs of infection develop (such as fever). °Document Released: 05/18/2000 Document Revised: 08/13/2011 Document Reviewed: 10/29/2008 °ExitCare® Patient Information ©  2015 [employer]. This information is not intended to replace advice given to you by your health care provider. Make sure you discuss any questions you have with your health care provider. ° °

## 2014-11-11 NOTE — ED Notes (Signed)
Pt reports weed eating around his house Monday.  Noticed itchy rash on his arms.

## 2015-12-27 ENCOUNTER — Emergency Department (HOSPITAL_COMMUNITY)
Admission: EM | Admit: 2015-12-27 | Discharge: 2015-12-27 | Disposition: A | Discharge status: Home / Self Care | Payer: Medicaid Other | Attending: Emergency Medicine | Admitting: Emergency Medicine

## 2015-12-27 ENCOUNTER — Encounter (HOSPITAL_COMMUNITY): Payer: Self-pay | Admitting: Emergency Medicine

## 2015-12-27 DIAGNOSIS — I10 Essential (primary) hypertension: Secondary | ICD-10-CM | POA: Insufficient documentation

## 2015-12-27 DIAGNOSIS — T148XXA Other injury of unspecified body region, initial encounter: Secondary | ICD-10-CM

## 2015-12-27 DIAGNOSIS — M545 Low back pain, unspecified: Secondary | ICD-10-CM

## 2015-12-27 DIAGNOSIS — R300 Dysuria: Secondary | ICD-10-CM | POA: Insufficient documentation

## 2015-12-27 DIAGNOSIS — M1990 Unspecified osteoarthritis, unspecified site: Secondary | ICD-10-CM | POA: Insufficient documentation

## 2015-12-27 DIAGNOSIS — Z7952 Long term (current) use of systemic steroids: Secondary | ICD-10-CM | POA: Insufficient documentation

## 2015-12-27 DIAGNOSIS — F17210 Nicotine dependence, cigarettes, uncomplicated: Secondary | ICD-10-CM | POA: Insufficient documentation

## 2015-12-27 DIAGNOSIS — Z7982 Long term (current) use of aspirin: Secondary | ICD-10-CM | POA: Insufficient documentation

## 2015-12-27 DIAGNOSIS — Z79899 Other long term (current) drug therapy: Secondary | ICD-10-CM | POA: Insufficient documentation

## 2015-12-27 MED ORDER — HYDROCODONE-ACETAMINOPHEN 5-325 MG PO TABS: 2.0000 | ORAL_TABLET | ORAL | 0 refills | Status: DC | PRN

## 2015-12-27 MED ORDER — METHOCARBAMOL 500 MG PO TABS: 500.0000 mg | ORAL_TABLET | Freq: Two times a day (BID) | ORAL | 0 refills | Status: AC

## 2015-12-27 NOTE — ED Provider Notes (Signed)
[facility]-EMERGENCY DEPT Provider Note   CSN: [id number] Arrival date & time: 12/27/15  1109  First Provider Contact:  None    By signing my name below, I, [name], attest that this documentation has been prepared under the direction and in the presence of Langston Masker, [place].  Electronically Signed: [name], ED Scribe. 12/27/15. 12:42 PM.   History   Chief Complaint Chief Complaint  Patient presents with  . Back Pain    HPI Eddie Simpson is a 45 y.o. male who presents to the Emergency Department complaining of gradual onset, constant, lower back pain s/p ground level fall that occurred 5 days ago. Pt reports that he was working outside when he tripped over roots on the ground and fell, landing onto his chest. No head injury or LOC. Pt also complains of 1 episode pf dysuria yesterday, none today. He has been taking Tylenol without relief. Pt is able to ambulate. Denies hematuria, urinary or bowel incontinence, saddle anesthesia, weakness, numbness, tingling, or any other associated symptoms.   The history is provided by the patient. No language interpreter was used.    Past Medical History:  Diagnosis Date  . Anxiety    seen by Dr. Tiburcio Pea, Psych., started xanax for anxiety, was also given Cymbalta & he didn't like the drug's effect   . Arthritis    R shoulder- RCT- needs attention per pt.   Marland Kitchen GERD (gastroesophageal reflux disease)    rare use of acid reflux  . Headache(784.0)    been told that he has headaches relative to  sinus problem relative to teeth  malposition    . Hypertension    no Rx yet, but pt. remarks that his BP is sometimes ^   . Pneumonia    as a child    There are no active problems to display for this patient.   Past Surgical History:  Procedure Laterality Date  . HERNIA REPAIR     inguinal- 2006  . TOOTH EXTRACTION  05/11/2011   Procedure: DENTAL RESTORATION/EXTRACTIONS;  Surgeon: Georgia Lopes;  Location: [facility] OR;  Service: Oral Surgery;   Laterality: N/A;  Multible Extractions        Home Medications    Prior to Admission medications   Medication Sig Start Date End Date Taking? Authorizing Provider  ALPRAZolam Prudy Feeler) 1 MG tablet Take 1 mg by mouth 3 (three) times daily as needed. For anxiety or sleep    Historical Provider, MD  Aspirin-Salicylamide-Caffeine (BC HEADACHE POWDER PO) Take 1 Package by mouth every 6 (six) hours as needed. For headache     Historical Provider, MD  diphenhydrAMINE (BENADRYL) 25 MG tablet Take 1 tablet (25 mg total) by mouth every 6 (six) hours as needed for itching. 11/11/14   Fayrene Helper, PA-C  HYDROcodone-acetaminophen (NORCO/VICODIN) 5-325 MG per tablet Take 2 tablets by mouth every 4 (four) hours as needed for pain. 05/29/12   Gilda Crease, MD  predniSONE (DELTASONE) 20 MG tablet 3 tabs po daily x 3 days, then 2 tabs x 3 days, then 1.5 tabs x 3 days, then 1 tab x 3 days, then 0.5 tabs x 3 days 11/11/14   Fayrene Helper, PA-C    Family History Family History  Problem Relation Age of Onset  . Anesthesia problems Neg Hx   . Hypotension Neg Hx   . Malignant hyperthermia Neg Hx   . Pseudochol deficiency Neg Hx     Social History Social History  Substance Use Topics  .  Smoking status: Current Every Day Smoker    Packs/day: 0.50    Years: 10.00    Types: Cigarettes  . Smokeless tobacco: Never Used  . Alcohol use No     Allergies   Bee venom; Flexeril [cyclobenzaprine]; and Ibuprofen   Review of Systems Review of Systems  Gastrointestinal:       Negative for bowel incontinence  Genitourinary: Positive for dysuria. Negative for hematuria.       Negative for urinary incontinence  Musculoskeletal: Positive for back pain.  Neurological: Negative for weakness and numbness.       Negative for saddle anesthesia  All other systems reviewed and are negative.    Physical Exam Updated Vital Signs BP 120/88 (BP Location: Left Arm)   Pulse 77   Temp 98.2 F (36.8 C) (Oral)    SpO2 98%   Physical Exam  Constitutional: He is oriented to person, place, and time. He appears well-developed and well-nourished. No distress.  HENT:  Head: Normocephalic and atraumatic.  Eyes: Conjunctivae and EOM are normal.  Neck: Neck supple. No tracheal deviation present.  Cardiovascular: Normal rate.   Pulmonary/Chest: Effort normal. No respiratory distress.  Musculoskeletal: Normal range of motion. He exhibits tenderness.  Diffusely tender left flank and midline lower back  Neurological: He is alert and oriented to person, place, and time.  Skin: Skin is warm and dry.  Psychiatric: He has a normal mood and affect. His behavior is normal.  Nursing note and vitals reviewed.    ED Treatments / Results   DIAGNOSTIC STUDIES: Oxygen Saturation is 98% on RA, normal by my interpretation.    COORDINATION OF CARE: 12:41 PM-Discussed treatment plan which includes Rx muscle relaxants with pt at bedside and pt agreed to plan.    Labs (all labs ordered are listed, but only abnormal results are displayed) Labs Reviewed - No data to display  EKG  EKG Interpretation None       Radiology No results found.  Procedures Procedures (including critical care time)  Medications Ordered in ED Medications - No data to display   Initial Impression / Assessment and Plan / ED Course  I have reviewed the triage vital signs and the nursing notes.  Pertinent labs & imaging results that were available during my care of the patient were reviewed by me and considered in my medical decision making (see chart for details).  Clinical Course     I personally performed the services in this documentation, which was scribed in my presence.  The recorded information has been reviewed and considered.   Barnet Pall.   Final Clinical Impressions(s) / ED Diagnoses   Final diagnoses:  Bilateral low back pain without sciatica  Muscle strain    New Prescriptions New Prescriptions   No  medications on file   Meds ordered this encounter  Medications  . HYDROcodone-acetaminophen (NORCO/VICODIN) 5-325 MG tablet    Sig: Take 2 tablets by mouth every 4 (four) hours as needed.    Dispense:  10 tablet    Refill:  0    Order Specific Question:   Supervising Provider    Answer:   Cathren Laine [[id number]]  . methocarbamol (ROBAXIN) 500 MG tablet    Sig: Take 1 tablet (500 mg total) by mouth 2 (two) times daily.    Dispense:  20 tablet    Refill:  0    Order Specific Question:   Supervising Provider    Answer:   Cathren Laine [phone]  Lonia Skinner Keene, PA-C 12/27/15 1314    Doug Sou, MD 12/27/15 [phone]

## 2015-12-27 NOTE — ED Triage Notes (Signed)
Pt fell from porch on Friday. Felt like a muscle strain but has gotten worse.  No history of back problems.  Pain hurts worse when getting up and down. Hurts even when dressing or turning in bed.  No urinary symptoms.

## 2016-11-21 ENCOUNTER — Encounter (HOSPITAL_COMMUNITY): Payer: Self-pay | Admitting: Emergency Medicine

## 2016-11-21 ENCOUNTER — Emergency Department (HOSPITAL_COMMUNITY)
Admission: EM | Admit: 2016-11-21 | Discharge: 2016-11-21 | Disposition: A | Discharge status: Home / Self Care | Payer: Medicaid Other | Attending: Emergency Medicine | Admitting: Emergency Medicine

## 2016-11-21 DIAGNOSIS — Z79899 Other long term (current) drug therapy: Secondary | ICD-10-CM | POA: Insufficient documentation

## 2016-11-21 DIAGNOSIS — K4090 Unilateral inguinal hernia, without obstruction or gangrene, not specified as recurrent: Secondary | ICD-10-CM

## 2016-11-21 DIAGNOSIS — K4091 Unilateral inguinal hernia, without obstruction or gangrene, recurrent: Secondary | ICD-10-CM | POA: Insufficient documentation

## 2016-11-21 DIAGNOSIS — I10 Essential (primary) hypertension: Secondary | ICD-10-CM | POA: Insufficient documentation

## 2016-11-21 DIAGNOSIS — F17210 Nicotine dependence, cigarettes, uncomplicated: Secondary | ICD-10-CM | POA: Insufficient documentation

## 2016-11-21 MED ORDER — OXYCODONE-ACETAMINOPHEN 5-325 MG PO TABS
2.0000 | ORAL_TABLET | Freq: Once | ORAL | Status: AC
  Administered 2016-11-21: 2 via ORAL
  Filled 2016-11-21: qty 2

## 2016-11-21 MED ORDER — DOCUSATE SODIUM 100 MG PO CAPS: 100.0000 mg | ORAL_CAPSULE | Freq: Two times a day (BID) | ORAL | 0 refills | Status: AC

## 2016-11-21 MED ORDER — OXYCODONE-ACETAMINOPHEN 5-325 MG PO TABS: 1.0000 | ORAL_TABLET | ORAL | 0 refills | Status: DC | PRN

## 2016-11-21 NOTE — ED Triage Notes (Signed)
Patient c/o left groin pain with occasional swelling to left testicle. Per patient pain and swelling is from inguinal hernia. Patient states "I had one on my right side in which I had surgery." Patient states pain and swelling started a few months ago while at work.

## 2016-11-21 NOTE — ED Notes (Signed)
Pt made aware to return if symptoms worsen or if any life threatening symptoms occur.   

## 2016-11-21 NOTE — ED Provider Notes (Signed)
[facility]-EMERGENCY DEPT Provider Note   CSN: [id number] Arrival date & time: 11/21/16  1042     History   Chief Complaint Chief Complaint  Patient presents with  . Groin Pain    HPI Eddie Simpson is a 46 y.o. male.  HPI  46 year old male presents with right inguinal hernia. He states he's had this hernia for about 6 months and has progressively gotten larger. The swelling goes into his right testicle. At times it becomes very swollen, especially after a full day of work. It has been quite painful for about one month. It has not been worsening and today he came in because he is tired of it. Several years ago he had a left inguinal hernia repaired in [place]. He does not room without surgeons name. He has not had any nausea or vomiting. Sometimes he has pain with having a bowel movement but no significant constipation. Sometimes it is difficult to fully reduce it by himself but it never feels like it is completely stuck except it is so large. No abdominal pain or swelling. Tried a BC powder for the pain but he states it was tearing up his stomach so he stopped. He states Tylenol doesn't work so didn't take that either.  Past Medical History:  Diagnosis Date  . Anxiety    seen by Dr. Tiburcio Pea, Psych., started xanax for anxiety, was also given Cymbalta & he didn't like the drug's effect   . Arthritis    R shoulder- RCT- needs attention per pt.   Marland Kitchen GERD (gastroesophageal reflux disease)    rare use of acid reflux  . Headache(784.0)    been told that he has headaches relative to  sinus problem relative to teeth  malposition    . Hypertension    no Rx yet, but pt. remarks that his BP is sometimes ^   . Pneumonia    as a child    There are no active problems to display for this patient.   Past Surgical History:  Procedure Laterality Date  . HERNIA REPAIR     inguinal- 2006  . TOOTH EXTRACTION  05/11/2011   Procedure: DENTAL RESTORATION/EXTRACTIONS;  Surgeon: Georgia Lopes;   Location: [facility] OR;  Service: Oral Surgery;  Laterality: N/A;  Multible Extractions        Home Medications    Prior to Admission medications   Medication Sig Start Date End Date Taking? Authorizing Provider  Aspirin-Salicylamide-Caffeine (BC HEADACHE POWDER PO) Take 1 Package by mouth every 6 (six) hours as needed. For headache    Yes [provider]  diphenhydrAMINE (BENADRYL) 25 MG tablet Take 1 tablet (25 mg total) by mouth every 6 (six) hours as needed for itching. 11/11/14  Yes Fayrene Helper, PA-C  ALPRAZolam Prudy Feeler) 1 MG tablet Take 1 mg by mouth 3 (three) times daily as needed. For anxiety or sleep    [provider]  docusate sodium (COLACE) 100 MG capsule Take 1 capsule (100 mg total) by mouth every 12 (twelve) hours. 11/21/16   Pricilla Loveless, MD  HYDROcodone-acetaminophen (NORCO/VICODIN) 5-325 MG tablet Take 2 tablets by mouth every 4 (four) hours as needed. Patient not taking: Reported on 11/21/2016 12/27/15   Elson Areas, PA-C  methocarbamol (ROBAXIN) 500 MG tablet Take 1 tablet (500 mg total) by mouth 2 (two) times daily. Patient not taking: Reported on 11/21/2016 12/27/15   Elson Areas, PA-C  oxyCODONE-acetaminophen (PERCOCET) 5-325 MG tablet Take 1-2 tablets by mouth every 4 (four) hours  as needed for severe pain. 11/21/16   Pricilla Loveless, MD  predniSONE (DELTASONE) 20 MG tablet 3 tabs po daily x 3 days, then 2 tabs x 3 days, then 1.5 tabs x 3 days, then 1 tab x 3 days, then 0.5 tabs x 3 days Patient not taking: Reported on 11/21/2016 11/11/14   Fayrene Helper, PA-C    Family History Family History  Problem Relation Age of Onset  . Anesthesia problems Neg Hx   . Hypotension Neg Hx   . Malignant hyperthermia Neg Hx   . Pseudochol deficiency Neg Hx     Social History Social History  Substance Use Topics  . Smoking status: Current Every Day Smoker    Packs/day: 1.00    Years: 10.00    Types: Cigarettes  . Smokeless tobacco: Never Used  . Alcohol use  Yes     Allergies   Bee venom; Flexeril [cyclobenzaprine]; and Ibuprofen   Review of Systems Review of Systems  Constitutional: Negative for fever.  Gastrointestinal: Negative for abdominal pain and vomiting.  Genitourinary: Negative for testicular pain.       Right inguinal hernia  All other systems reviewed and are negative.    Physical Exam Updated Vital Signs BP (!) 135/107 (BP Location: Right Arm)   Pulse 91   Temp 98.8 F (37.1 C) (Oral)   Resp 18   Ht 5\' 11"  (1.803 m)   Wt 102.5 kg (226 lb)   SpO2 98%   BMI 31.52 kg/m   Physical Exam  Constitutional: He is oriented to person, place, and time. He appears well-developed and well-nourished.  HENT:  Head: Normocephalic and atraumatic.  Right Ear: External ear normal.  Left Ear: External ear normal.  Nose: Nose normal.  Eyes: Right eye exhibits no discharge. Left eye exhibits no discharge.  Cardiovascular: Normal rate, regular rhythm and normal heart sounds.   Pulmonary/Chest: Effort normal and breath sounds normal.  Abdominal: Soft. He exhibits no distension. There is no tenderness. A hernia is present. Hernia confirmed positive in the right inguinal area.  Genitourinary: Penis normal. Right testis shows no tenderness.  Genitourinary Comments: Right inguinal hernia with bowel going partially into scrotum. However this is soft and reducible. No skin changes  Musculoskeletal: He exhibits no edema.  Neurological: He is alert and oriented to person, place, and time.  Skin: Skin is warm and dry.  Nursing note and vitals reviewed.    ED Treatments / Results  Labs (all labs ordered are listed, but only abnormal results are displayed) Labs Reviewed - No data to display  EKG  EKG Interpretation None       Radiology No results found.  Procedures Procedures (including critical care time)  Medications Ordered in ED Medications  oxyCODONE-acetaminophen (PERCOCET/ROXICET) 5-325 MG per tablet 2 tablet (2  tablets Oral Given 11/21/16 1200)     Initial Impression / Assessment and Plan / ED Course  I have reviewed the triage vital signs and the nursing notes.  Pertinent labs & imaging results that were available during my care of the patient were reviewed by me and considered in my medical decision making (see chart for details).     Patient presented with a right inguinal hernia that has been present for months. It does seem large but at this point is not incarcerated or strangulated. Patient will be given oral pain control and advised to follow up with a surgeon. Discussed strict return precautions such as inability to reduce, firmness of the hernia, severe  pain, vomiting, etc. He also was noted to be mildly hypertensive and told to follow-up with a PCP for further reevaluation of this.  Final Clinical Impressions(s) / ED Diagnoses   Final diagnoses:  Right inguinal hernia    New Prescriptions New Prescriptions   DOCUSATE SODIUM (COLACE) 100 MG CAPSULE    Take 1 capsule (100 mg total) by mouth every 12 (twelve) hours.   OXYCODONE-ACETAMINOPHEN (PERCOCET) 5-325 MG TABLET    Take 1-2 tablets by mouth every 4 (four) hours as needed for severe pain.     Pricilla LovelessGoldston, Torion Hulgan, MD 11/21/16 [phone]

## 2017-08-20 ENCOUNTER — Other Ambulatory Visit: Payer: Self-pay

## 2017-08-20 ENCOUNTER — Emergency Department (HOSPITAL_COMMUNITY)
Admission: EM | Admit: 2017-08-20 | Discharge: 2017-08-20 | Disposition: A | Discharge status: Home / Self Care | Payer: Self-pay | Attending: Emergency Medicine | Admitting: Emergency Medicine

## 2017-08-20 ENCOUNTER — Encounter (HOSPITAL_COMMUNITY): Payer: Self-pay | Admitting: Emergency Medicine

## 2017-08-20 ENCOUNTER — Emergency Department (HOSPITAL_COMMUNITY): Payer: Self-pay

## 2017-08-20 DIAGNOSIS — F17210 Nicotine dependence, cigarettes, uncomplicated: Secondary | ICD-10-CM | POA: Insufficient documentation

## 2017-08-20 DIAGNOSIS — K4090 Unilateral inguinal hernia, without obstruction or gangrene, not specified as recurrent: Secondary | ICD-10-CM | POA: Insufficient documentation

## 2017-08-20 DIAGNOSIS — I10 Essential (primary) hypertension: Secondary | ICD-10-CM | POA: Insufficient documentation

## 2017-08-20 MED ORDER — SODIUM CHLORIDE 0.9 % IV BOLUS (SEPSIS): 1000.0000 mL | Freq: Once | INTRAVENOUS | Status: DC

## 2017-08-20 MED ORDER — ONDANSETRON HCL 4 MG/2ML IJ SOLN
4.0000 mg | Freq: Once | INTRAMUSCULAR | Status: DC
  Filled 2017-08-20: qty 2

## 2017-08-20 MED ORDER — SODIUM CHLORIDE 0.9 % IV SOLN: INTRAVENOUS | Status: DC

## 2017-08-20 NOTE — ED Notes (Signed)
Pt is refusing all medications and interventions.

## 2017-08-20 NOTE — Discharge Instructions (Signed)
Return for any new or worse symptoms.  Nausea vomiting fevers worse belly pain.  Call general surgery for appointment hernia needs to be fixed.

## 2017-08-20 NOTE — ED Provider Notes (Addendum)
[facility] EMERGENCY DEPARTMENT Provider Note   CSN: [id number] Arrival date & time: 08/20/17  0946     History   Chief Complaint Chief Complaint  Patient presents with  . Groin Pain    HPI Eddie Simpson is a 47 y.o. male.  Patient with history of right anal hernia at least documented by medical record since June 2018.  Patient states the bulge is been getting much bigger over time now is quite large.  Causing discomfort for the past.  States that the pain that radiates into the right upper leg area.  Patient has not followed up with general surgery.  Denies fever nausea or vomiting.      Past Medical History:  Diagnosis Date  . Anxiety    seen by Dr. Tiburcio Pea, Psych., started xanax for anxiety, was also given Cymbalta & he didn't like the drug's effect   . Arthritis    R shoulder- RCT- needs attention per pt.   Marland Kitchen GERD (gastroesophageal reflux disease)    rare use of acid reflux  . Headache(784.0)    been told that he has headaches relative to  sinus problem relative to teeth  malposition    . Hypertension    no Rx yet, but pt. remarks that his BP is sometimes ^   . Pneumonia    as a child    There are no active problems to display for this patient.   Past Surgical History:  Procedure Laterality Date  . HERNIA REPAIR     inguinal- 2006  . TOOTH EXTRACTION  05/11/2011   Procedure: DENTAL RESTORATION/EXTRACTIONS;  Surgeon: Georgia Lopes;  Location: [facility] OR;  Service: Oral Surgery;  Laterality: N/A;  Multible Extractions        Home Medications    Prior to Admission medications   Medication Sig Start Date End Date Taking? Authorizing Provider  Aspirin-Salicylamide-Caffeine (BC HEADACHE POWDER PO) Take 1 Package by mouth every 6 (six) hours as needed. For headache    Yes [provider]  diphenhydrAMINE (BENADRYL) 25 MG tablet Take 1 tablet (25 mg total) by mouth every 6 (six) hours as needed for itching. Patient not taking: Reported on 08/20/2017  11/11/14   Fayrene Helper, PA-C  docusate sodium (COLACE) 100 MG capsule Take 1 capsule (100 mg total) by mouth every 12 (twelve) hours. Patient not taking: Reported on 08/20/2017 11/21/16   Pricilla Loveless, MD  HYDROcodone-acetaminophen (NORCO/VICODIN) 5-325 MG tablet Take 2 tablets by mouth every 4 (four) hours as needed. Patient not taking: Reported on 11/21/2016 12/27/15   Elson Areas, PA-C  methocarbamol (ROBAXIN) 500 MG tablet Take 1 tablet (500 mg total) by mouth 2 (two) times daily. Patient not taking: Reported on 11/21/2016 12/27/15   Elson Areas, PA-C  oxyCODONE-acetaminophen (PERCOCET) 5-325 MG tablet Take 1-2 tablets by mouth every 4 (four) hours as needed for severe pain. Patient not taking: Reported on 08/20/2017 11/21/16   Pricilla Loveless, MD  predniSONE (DELTASONE) 20 MG tablet 3 tabs po daily x 3 days, then 2 tabs x 3 days, then 1.5 tabs x 3 days, then 1 tab x 3 days, then 0.5 tabs x 3 days Patient not taking: Reported on 11/21/2016 11/11/14   Fayrene Helper, PA-C    Family History Family History  Problem Relation Age of Onset  . Anesthesia problems Neg Hx   . Hypotension Neg Hx   . Malignant hyperthermia Neg Hx   . Pseudochol deficiency Neg Hx     Social  History Social History   Tobacco Use  . Smoking status: Current Every Day Smoker    Packs/day: 1.00    Years: 10.00    Pack years: 10.00    Types: Cigarettes  . Smokeless tobacco: Never Used  Substance Use Topics  . Alcohol use: Yes  . Drug use: No     Allergies   Bee venom; Flexeril [cyclobenzaprine]; and Ibuprofen   Review of Systems Review of Systems  Constitutional: Negative for fever.  HENT: Negative for congestion.   Eyes: Negative for redness.  Respiratory: Negative for shortness of breath.   Cardiovascular: Negative for chest pain.  Gastrointestinal: Positive for abdominal pain.  Genitourinary: Positive for scrotal swelling and testicular pain.  Musculoskeletal: Negative for back pain.  Skin:  Negative for rash.  Neurological: Negative for headaches.  Hematological: Does not bruise/bleed easily.  Psychiatric/Behavioral: Negative for confusion.     Physical Exam Updated Vital Signs BP (!) 120/97 (BP Location: Right Arm)   Pulse 78   Temp 98.4 F (36.9 C) (Oral)   Resp 18   Ht 1.803 m (5\' 11" )   Wt 103.6 kg (228 lb 8 oz)   SpO2 100%   BMI 31.87 kg/m   Physical Exam  Constitutional: He is oriented to person, place, and time. He appears well-developed and well-nourished. No distress.  HENT:  Head: Normocephalic and atraumatic.  Mouth/Throat: Oropharynx is clear and moist.  Eyes: Conjunctivae and EOM are normal. Pupils are equal, round, and reactive to light.  Neck: Normal range of motion. Neck supple.  Cardiovascular: Normal rate, regular rhythm and normal heart sounds.  Pulmonary/Chest: Effort normal and breath sounds normal.  Abdominal: Soft. Bowel sounds are normal. There is no tenderness.  Tenderness to palpation right lower quadrant.  Genitourinary:  Genitourinary Comments: Large right inguinal hernia down into the scrotum.  Reducible some discomfort associated with it.  Also some palpable tenderness in the right lower quadrant area of the abdomen.  No guarding.  Testicle on the right side seems to be normal.  Musculoskeletal: Normal range of motion. He exhibits no edema.  Neurological: He is alert and oriented to person, place, and time. No cranial nerve deficit or sensory deficit. He exhibits normal muscle tone. Coordination normal.  Skin: Skin is warm.  Nursing note and vitals reviewed.    ED Treatments / Results  Labs (all labs ordered are listed, but only abnormal results are displayed) Labs Reviewed  CBC WITH DIFFERENTIAL/PLATELET  COMPREHENSIVE METABOLIC PANEL    EKG  EKG Interpretation None       Radiology No results found.  Procedures Procedures (including critical care time)  Medications Ordered in ED Medications  0.9 %  sodium  chloride infusion ( Intravenous Refused 08/20/17 1100)  sodium chloride 0.9 % bolus 1,000 mL (1,000 mLs Intravenous Refused 08/20/17 1100)  ondansetron (ZOFRAN) injection 4 mg (4 mg Intravenous Refused 08/20/17 1100)     Initial Impression / Assessment and Plan / ED Course  I have reviewed the triage vital signs and the nursing notes.  Pertinent labs & imaging results that were available during my care of the patient were reviewed by me and considered in my medical decision making (see chart for details).   Patient with large right inguinal hernia.  Some tenderness.  But reducible without much difficulty.  Then quickly comes back.  Went to do CT abdomen and pelvis due to the fact the patient talked about abdominal pain inside the abdomen in the right side of the abdomen.  Patient refused CT so gave referral to general surgery for inguinal hernia repair.  An incarcerated segment of bowel not completely ruled out.  Patient given precautions.  Patient refuses CT scan.  States he just wants to go.  Based on this patient will sign out AMA because of the right-sided abdominal pain there was some concern about possible strangulation or another process causing the right-sided abdominal pain.  Based on this patient will sign out AMA.   Final Clinical Impressions(s) / ED Diagnoses   Final diagnoses:  Right inguinal hernia    ED Discharge Orders    None       Vanetta Mulders, MD 08/20/17 1150    Vanetta Mulders, MD 08/20/17 1151

## 2017-08-20 NOTE — ED Notes (Signed)
Have notified MD of patient refusing everything

## 2017-08-20 NOTE — ED Notes (Signed)
Pt refusing IV and ct scan. EDP informed.

## 2017-08-20 NOTE — ED Triage Notes (Signed)
Pt states having a right sided inguinal hernia that has "dropped" down to my groin.  Pt states now having pain with it radiating to his entire leg

## 2021-08-02 ENCOUNTER — Other Ambulatory Visit: Payer: Self-pay

## 2021-08-02 ENCOUNTER — Emergency Department (HOSPITAL_COMMUNITY)
Admission: EM | Admit: 2021-08-02 | Discharge: 2021-08-02 | Disposition: A | Discharge status: Home / Self Care | Payer: 59 | Attending: Emergency Medicine | Admitting: Emergency Medicine

## 2021-08-02 ENCOUNTER — Encounter (HOSPITAL_COMMUNITY): Payer: Self-pay | Admitting: Emergency Medicine

## 2021-08-02 DIAGNOSIS — H9312 Tinnitus, left ear: Secondary | ICD-10-CM | POA: Insufficient documentation

## 2021-08-02 DIAGNOSIS — I10 Essential (primary) hypertension: Secondary | ICD-10-CM | POA: Insufficient documentation

## 2021-08-02 DIAGNOSIS — Z79899 Other long term (current) drug therapy: Secondary | ICD-10-CM | POA: Diagnosis not present

## 2021-08-02 DIAGNOSIS — K047 Periapical abscess without sinus: Secondary | ICD-10-CM | POA: Insufficient documentation

## 2021-08-02 DIAGNOSIS — K0889 Other specified disorders of teeth and supporting structures: Secondary | ICD-10-CM | POA: Diagnosis present

## 2021-08-02 MED ORDER — AMOXICILLIN 250 MG PO CAPS
500.0000 mg | ORAL_CAPSULE | Freq: Once | ORAL | Status: AC
  Administered 2021-08-02: 500 mg via ORAL
  Filled 2021-08-02: qty 2

## 2021-08-02 MED ORDER — HYDROCODONE-ACETAMINOPHEN 5-325 MG PO TABS: 1.0000 | ORAL_TABLET | Freq: Four times a day (QID) | ORAL | 0 refills | Status: DC | PRN

## 2021-08-02 MED ORDER — AMOXICILLIN 500 MG PO CAPS: 500.0000 mg | ORAL_CAPSULE | Freq: Three times a day (TID) | ORAL | 0 refills | Status: AC

## 2021-08-02 NOTE — Discharge Instructions (Addendum)
Take the entire course of the antibiotics prescribed.  Do not drive within 4 hours of taking hydrocodone as this medication will make you drowsy.  I have given you a list of dentists in our area and strongly encourage you to follow-up with a dentist for ongoing concerns with your teeth.  You may also want to check out the free clinic of [facility] to establish primary medical care.  They take patients who do not have insurance.  Your blood pressure is elevated here at 150/104 and this is very likely due to your dental pain today, however it is important for you to get follow-up recheck of this.  Check out the free clinic.  Also I have given you information regarding the [facility] where you can go for a blood pressure recheck which I recommend you completing within the next week. ? ? ?[facility] ? ?[address] ?[place], [place] [address] ?[phone] ? ?Services ?[facility] offers a variety of basic health services. ? ?Services include but are not limited to: ?Blood pressure checks  ?Heart rate checks  ?Blood sugar checks  ?Urine analysis  ?Rapid strep tests  ?Pregnancy tests.  ?Health education and referrals ? ?People needing more complex services will be directed to a physician online. Using these virtual visits, doctors can evaluate and prescribe medicine and treatments. ?There will be no medication on-site, though [facility] will help patients fill their prescriptions at little to no cost. ? ? ?For More information please go to: ?[email or web address] ? ?

## 2021-08-02 NOTE — ED Notes (Signed)
Patient states that he has had 6 wisdom teeth removed over a year ago and that now he has pain all over his mouth and teeth are breaking.  Also states that he has a ringing in his ears.  ?

## 2021-08-02 NOTE — ED Triage Notes (Signed)
Pt to the ED with complaints of lower dental pain for the past 3 days. ?

## 2021-08-04 NOTE — ED Provider Notes (Signed)
?[facility] ?Provider Note ? ? ?CSN: [id number] ?Arrival date & time: 08/02/21  1053 ? ?  ? ?History ? ?Chief Complaint  ?Patient presents with  ? Dental Pain  ? ? ?Eddie Simpson is a 51 y.o. male with history of HTN, GERD and chronic dental problems presenting with acute dental pain and gingival swelling along his left lower dentition which has escalated over the past 3 days.  He describes chronic dental pain which he tolerates until this escalation. Was seen years ago by Dr. 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 Unfortunately currently without dental insurance and does not have a primary dentist.  He denies fevers or chills, no vomiting, but states the pain makes him nauseated at times and has had insomnia the past 2 nights due to discomfort.  No difficulty swallowing.  He has tried bc powders directly on the dental area without relief.  ? ?He also describes a constant buzzing in his left ear which has been present for over the past year.  Denies reduced hearing acuity, denies headaches, dizziness or ear pain.   ? ?The history is provided by the patient.  ? ?  ? ?Home Medications ?Prior to Admission medications   ?Medication Sig Start Date End Date Taking? Authorizing Provider  ?amoxicillin (AMOXIL) 500 MG capsule Take 1 capsule (500 mg total) by mouth 3 (three) times daily for 10 days. 08/02/21 08/12/21 Yes Keylee Shrestha, Almyra Free, PA-C  ?HYDROcodone-acetaminophen (NORCO/VICODIN) 5-325 MG tablet Take 1 tablet by mouth every 6 (six) hours as needed. 08/02/21  Yes Evalee Jefferson, PA-C  ?Aspirin-Salicylamide-Caffeine (BC HEADACHE POWDER PO) Take 1 Package by mouth every 6 (six) hours as needed. For headache ?    [provider]  ?diphenhydrAMINE (BENADRYL) 25 MG tablet Take 1 tablet (25 mg total) by mouth every 6 (six) hours as needed for itching. ?Patient not taking: Reported on 08/20/2017 11/11/14   Domenic Moras, PA-C  ?docusate sodium  (COLACE) 100 MG capsule Take 1 capsule (100 mg total) by mouth every 12 (twelve) hours. ?Patient not taking: Reported on 08/20/2017 11/21/16   Sherwood Gambler, MD  ?methocarbamol (ROBAXIN) 500 MG tablet Take 1 tablet (500 mg total) by mouth 2 (two) times daily. ?Patient not taking: Reported on 11/21/2016 12/27/15   Fransico Meadow, PA-C  ?predniSONE (DELTASONE) 20 MG tablet 3 tabs po daily x 3 days, then 2 tabs x 3 days, then 1.5 tabs x 3 days, then 1 tab x 3 days, then 0.5 tabs x 3 days ?Patient not taking: Reported on 11/21/2016 11/11/14   Domenic Moras, PA-C  ?   ? ?Allergies    ?Bee venom, Flexeril [cyclobenzaprine], and Ibuprofen   ? ?Review of Systems   ?Review of Systems  ?Constitutional:  Negative for fever.  ?HENT:  Positive for dental problem. Negative for facial swelling and sore throat.   ?Respiratory:  Negative for shortness of breath.   ?Musculoskeletal:  Negative for neck pain and neck stiffness.  ?All other systems reviewed and are negative. ? ?Physical Exam ?Updated Vital Signs ?BP (!) 150/104 (BP Location: Right Arm)   Pulse 68   Temp 97.6 ?F (36.4 ?C) (Oral)   Resp 20   Ht 5\' 11"  (1.803 m)   Wt 103.6 kg   SpO2 99%   BMI 31.85 kg/m?  ?Physical Exam ?Constitutional:   ?   General: He is not in acute distress. ?   Appearance: He is well-developed.  ?HENT:  ?  Head: Normocephalic and atraumatic.  ?   Jaw: No trismus.  ?   Right Ear: Tympanic membrane and external ear normal. No middle ear effusion. There is no impacted cerumen. No mastoid tenderness.  ?   Left Ear: Tympanic membrane and external ear normal.  No middle ear effusion. There is no impacted cerumen. No mastoid tenderness.  ?   Mouth/Throat:  ?   Mouth: Mucous membranes are moist. No oral lesions.  ?   Dentition: Abnormal dentition. Dental tenderness and gingival swelling present. No dental abscesses.  ?   Pharynx: Oropharynx is clear. Uvula midline. No posterior oropharyngeal erythema or uvula swelling.  ?   Comments: Poor dentition with  gingival recession, multiple extracted molars. Left lower 2nd premolar significantly decayed and ttp.  Buccal gingival edema with erythema, no fluctuant mass.  Sublingual space soft,  no facial erythema or induration. ?Eyes:  ?   Conjunctiva/sclera: Conjunctivae normal.  ?Cardiovascular:  ?   Rate and Rhythm: Normal rate.  ?   Heart sounds: Normal heart sounds.  ?Pulmonary:  ?   Effort: Pulmonary effort is normal.  ?Abdominal:  ?   General: There is no distension.  ?Musculoskeletal:     ?   General: Normal range of motion.  ?   Cervical back: Normal range of motion and neck supple.  ?Lymphadenopathy:  ?   Cervical: No cervical adenopathy.  ?Skin: ?   General: Skin is warm and dry.  ?   Findings: No erythema.  ?Neurological:  ?   Mental Status: He is alert and oriented to person, place, and time.  ? ? ?ED Results / Procedures / Treatments   ?Labs ?(all labs ordered are listed, but only abnormal results are displayed) ?Labs Reviewed - No data to display ? ?EKG ?None ? ?Radiology ?No results found. ? ?Procedures ?Procedures  ? ? ?Medications Ordered in ED ?Medications  ?amoxicillin (AMOXIL) capsule 500 mg (500 mg Oral Given 08/02/21 1142)  ? ? ?ED Course/ Medical Decision Making/ A&P ?  ?                        ?Medical Decision Making ?Pt with acute dental infection without drainable abscess, chronic dental decay. 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  Sublingual space is soft, no evidence of ludwigs angina, also no pharyngeal compromise. Pt was started on abx, short pain medication.  Advised need for dental f/u, referral given.  Also discussed need for pcp for general care, HTN recheck, further eval of tinnitus which at this time is chronic with no red flags for emergent condition.  Referrals given for pcp. Return precautions outlined.  Advised have bp rechecked at [facility] within a week. ? ?Risk ?OTC drugs. ?Prescription drug  management. ? ? ? ? ? ? ? ? ? ? ?Final Clinical Impression(s) / ED Diagnoses ?Final diagnoses:  ?Dental infection  ?Tinnitus of left ear  ? ? ?Rx / DC Orders ?ED Discharge Orders   ? ?      Ordered  ?  amoxicillin (AMOXIL) 500 MG capsule  3 times daily       ? 08/02/21 1137  ?  HYDROcodone-acetaminophen (NORCO/VICODIN) 5-325 MG tablet  Every 6 hours PRN       ? 08/02/21 1137  ? ?  ?  ? ?  ? ? ?  ?Evalee Jefferson, PA-C ?08/04/21 [id number] ? ?  ?Noemi Chapel, MD ?  08/06/21 0708 ? ?

## 2021-10-21 ENCOUNTER — Emergency Department (HOSPITAL_COMMUNITY)
Admission: EM | Admit: 2021-10-21 | Discharge: 2021-10-21 | Disposition: A | Discharge status: Home / Self Care | Payer: 59 | Attending: Emergency Medicine | Admitting: Emergency Medicine

## 2021-10-21 ENCOUNTER — Other Ambulatory Visit: Payer: Self-pay

## 2021-10-21 ENCOUNTER — Encounter (HOSPITAL_COMMUNITY): Payer: Self-pay | Admitting: Emergency Medicine

## 2021-10-21 DIAGNOSIS — K0889 Other specified disorders of teeth and supporting structures: Secondary | ICD-10-CM

## 2021-10-21 DIAGNOSIS — K029 Dental caries, unspecified: Secondary | ICD-10-CM | POA: Diagnosis not present

## 2021-10-21 MED ORDER — AMOXICILLIN 250 MG PO CAPS
500.0000 mg | ORAL_CAPSULE | Freq: Once | ORAL | Status: AC
  Administered 2021-10-21: 500 mg via ORAL
  Filled 2021-10-21: qty 2

## 2021-10-21 MED ORDER — CHLORHEXIDINE GLUCONATE 0.12 % MT SOLN: 15.0000 mL | Freq: Two times a day (BID) | OROMUCOSAL | 0 refills | Status: AC

## 2021-10-21 MED ORDER — AMOXICILLIN 500 MG PO CAPS: 500.0000 mg | ORAL_CAPSULE | Freq: Four times a day (QID) | ORAL | 0 refills | Status: AC

## 2021-10-21 NOTE — Discharge Instructions (Addendum)
At this time there does not appear to be the presence of an emergent medical condition, however there is always the potential for conditions to change. Please read and follow the below instructions.  Please return to the Emergency Department immediately for any new or worsening symptoms. Please be sure to follow up with your Primary Care Provider within one week regarding your visit today; please call their office to schedule an appointment even if you are feeling better for a follow-up visit. Please take your antibiotic amoxicillin as prescribed until complete to help with your symptoms.  Please drink enough water to avoid dehydration and get plenty of rest. You may use the Peridex mouth rinse as directed to help with your symptoms.  Rinse and spit this medication out, do not swallow it. Please call your dentist for definitive dental care.  Go to the nearest Emergency Department immediately if: You have fever or chills You cannot open your mouth. You are having trouble breathing or swallowing. You have a fever. Your face, neck, or jaw is swollen. You have any new/concerning or worsening.   Please read the additional information packets attached to your discharge summary.  Do not take your medicine if  develop an itchy rash, swelling in your mouth or lips, or difficulty breathing; call 911 and seek immediate emergency medical attention if this occurs.  You may review your lab tests and imaging results in their entirety on your MyChart account.  Please discuss all results of fully with your primary care provider and other specialist at your follow-up visit.  Note: Portions of this text may have been transcribed using voice recognition software. Every effort was made to ensure accuracy; however, inadvertent computerized transcription errors may still be present.

## 2021-10-21 NOTE — ED Triage Notes (Addendum)
Patient c/o right upper and lower dental pain. Per patient has dental pain frequently and has been seen here for it in the past. Per patient started to cause increased pain in past 2 days and is unable to sleep. Denies any fevers. Patient taking BC powder, last took yesterday with no relief.

## 2021-10-21 NOTE — ED Provider Notes (Addendum)
[facility] EMERGENCY DEPARTMENT Provider Note   CSN: [id number] Arrival date & time: 10/21/21  1036     History  Chief Complaint  Patient presents with   Dental Pain    Eddie Simpson is a 51 y.o. male presents for evaluation of right-sided dental pain upper and lower.  Patient reports history of chronic dental pain for several months, he was seen at this ER in early March, treated with pain medication as well as amoxicillin with relief of symptoms.  Patient tried to call the dentist however they were not able to see him, patient reports that he has had continued pain since that time he describes as sharp, constant, worse with chewing on the right side, nonradiating.  Patient reports this feels similar to prior episodes of his dental pain.  He denies any swelling, difficulty swallowing, nausea/vomiting, trismus, fevers or any additional concerns.  Previously treated by Dr. Hoyt Koch.  HPI     Home Medications Prior to Admission medications   Medication Sig Start Date End Date Taking? Authorizing Provider  amoxicillin (AMOXIL) 500 MG capsule Take 1 capsule (500 mg total) by mouth 4 (four) times daily for 10 days. 10/21/21 10/31/21 Yes Nuala Alpha A, PA-C  chlorhexidine (PERIDEX) 0.12 % solution Use as directed 15 mLs in the mouth or throat 2 (two) times daily. Rinse and Spit. DO NOT SWALLOW. 10/21/21  Yes Nuala Alpha A, PA-C  Aspirin-Salicylamide-Caffeine (BC HEADACHE POWDER PO) Take 1 Package by mouth every 6 (six) hours as needed. For headache     [provider]  diphenhydrAMINE (BENADRYL) 25 MG tablet Take 1 tablet (25 mg total) by mouth every 6 (six) hours as needed for itching. Patient not taking: Reported on 08/20/2017 11/11/14   Domenic Moras, PA-C  docusate sodium (COLACE) 100 MG capsule Take 1 capsule (100 mg total) by mouth every 12 (twelve) hours. Patient not taking: Reported on 08/20/2017 11/21/16   Sherwood Gambler, MD  HYDROcodone-acetaminophen (NORCO/VICODIN)  5-325 MG tablet Take 1 tablet by mouth every 6 (six) hours as needed. 08/02/21   Evalee Jefferson, PA-C  methocarbamol (ROBAXIN) 500 MG tablet Take 1 tablet (500 mg total) by mouth 2 (two) times daily. Patient not taking: Reported on 11/21/2016 12/27/15   Fransico Meadow, PA-C  predniSONE (DELTASONE) 20 MG tablet 3 tabs po daily x 3 days, then 2 tabs x 3 days, then 1.5 tabs x 3 days, then 1 tab x 3 days, then 0.5 tabs x 3 days Patient not taking: Reported on 11/21/2016 11/11/14   Domenic Moras, PA-C      Allergies    Bee venom, Flexeril [cyclobenzaprine], and Ibuprofen    Review of Systems   Review of Systems  Constitutional:  Negative for fever.  HENT:  Positive for dental problem. Negative for sore throat and trouble swallowing.   Gastrointestinal:  Negative for nausea and vomiting.   Physical Exam Updated Vital Signs BP (!) 136/119 (BP Location: Right Arm)   Pulse 75   Temp 98 F (36.7 C) (Oral)   Resp 18   Ht 5\' 11"  (1.803 m)   Wt 107 kg   SpO2 100%   BMI 32.92 kg/m  Physical Exam Constitutional:      General: He is not in acute distress.    Appearance: Normal appearance. He is well-developed. He is not ill-appearing or diaphoretic.  HENT:     Head: Normocephalic and atraumatic.     Jaw: There is normal jaw occlusion. No trismus.  Comments: Poor dentition overall with several dental caries and eroded teeth.  Tenderness percussion on the right upper and lower molars.  The patient has normal phonation and is in control of secretions. No stridor.  Midline uvula without edema. Soft palate rises symmetrically.  No tonsillar erythema, swelling or exudates. Tongue protrusion is normal, floor of mouth is soft. No trismus. No creptius on neck palpation. No gingival erythema or fluctuance noted. Mucus membranes moist. No pallor noted.   Eyes:     General: Vision grossly intact. Gaze aligned appropriately.     Pupils: Pupils are equal, round, and reactive to light.  Neck:     Trachea:  Trachea and phonation normal.  Pulmonary:     Effort: Pulmonary effort is normal. No respiratory distress.  Abdominal:     General: There is no distension.     Palpations: Abdomen is soft.     Tenderness: There is no abdominal tenderness. There is no guarding or rebound.  Musculoskeletal:        General: Normal range of motion.     Cervical back: Normal range of motion and neck supple. No edema.  Skin:    General: Skin is warm and dry.  Neurological:     Mental Status: He is alert.     GCS: GCS eye subscore is 4. GCS verbal subscore is 5. GCS motor subscore is 6.     Comments: Speech is clear and goal oriented, follows commands Major Cranial nerves without deficit, no facial droop Moves extremities without ataxia, coordination intact  Psychiatric:        Behavior: Behavior normal.    ED Results / Procedures / Treatments   Labs (all labs ordered are listed, but only abnormal results are displayed) Labs Reviewed - No data to display  EKG None  Radiology No results found.  Procedures Procedures    Medications Ordered in ED Medications  amoxicillin (AMOXIL) capsule 500 mg (500 mg Oral Given 10/21/21 1237)    ED Course/ Medical Decision Making/ A&P                           Medical Decision Making 51 year old male presented with acute on chronic right upper and lower dental pain.  Differential includes but not limited to dental abscess, cavities, Ludewig's angina, PTA, RPA, cellulitis.    Patient with infected surface dental cavities noted in several rotted teeth.  No swelling erythema or tenderness of the gums, no signs of dental abscess.  Additionally patient is well-appearing afebrile nontoxic and speaking well.  Patient is tolerating p.o. in the room drinking Sprite.  No evidence at this time to suggest Ludewig's angina, peritonsillar abscess, retropharyngeal abscess or other deep space infections of the head or neck.  Plan of care is treated with amoxicillin, OTC  anti-inflammatories, chlorhexidine mouth rinse and referral to dentist.  Patient is to call their office today to schedule a follow-up appointment for definitive dental care.   Patient previously treated by dentist Dr. Hoyt Koch?  He was given contact information for their office.   Amount and/or Complexity of Data Reviewed Independent Historian: parent External Data Reviewed: notes.    Details: Reviewed previous documentation patient seen in the ER on 08/02/2021 diagnosis dental infection and tinnitus of the left ear.  He was prescribed hydrocodone-acetaminophen along with amoxicillin 500 mg 3 times daily, he was referred to a free clinic of [facility].  Risk OTC drugs. Prescription drug management.  At this time there does not appear to be any evidence of an acute emergency medical condition and the patient appears stable for discharge with appropriate outpatient follow up. Diagnosis was discussed with patient who verbalizes understanding of care plan and is agreeable to discharge. I have discussed return precautions with patient who verbalizes understanding. Patient encouraged to follow-up with their PCP and dentist. All questions answered.       Of note patient did not use nasal canula. Charting error. RN confirmed and will change.  Note: Portions of this report may have been transcribed using voice recognition software. Every effort was made to ensure accuracy; however, inadvertent computerized transcription errors may still be present.         Final Clinical Impression(s) / ED Diagnoses Final diagnoses:  Pain, dental    Rx / DC Orders ED Discharge Orders          Ordered    amoxicillin (AMOXIL) 500 MG capsule  4 times daily        10/21/21 1248    chlorhexidine (PERIDEX) 0.12 % solution  2 times daily        10/21/21 1248              Gari Crown 10/21/21 1337    Deliah Boston, PA-C 10/21/21 1338    Pattricia Boss,  MD 10/23/21 1402

## 2022-01-06 ENCOUNTER — Emergency Department (HOSPITAL_COMMUNITY)
Admission: EM | Admit: 2022-01-06 | Discharge: 2022-01-06 | Disposition: A | Discharge status: Home / Self Care | Payer: 59 | Attending: Emergency Medicine | Admitting: Emergency Medicine

## 2022-01-06 ENCOUNTER — Encounter (HOSPITAL_COMMUNITY): Payer: Self-pay

## 2022-01-06 DIAGNOSIS — Z7982 Long term (current) use of aspirin: Secondary | ICD-10-CM | POA: Diagnosis not present

## 2022-01-06 DIAGNOSIS — K0889 Other specified disorders of teeth and supporting structures: Secondary | ICD-10-CM | POA: Insufficient documentation

## 2022-01-06 MED ORDER — PENICILLIN V POTASSIUM 500 MG PO TABS: 500.0000 mg | ORAL_TABLET | Freq: Four times a day (QID) | ORAL | 0 refills | Status: AC

## 2022-01-06 MED ORDER — HYDROCODONE-ACETAMINOPHEN 5-325 MG PO TABS: ORAL_TABLET | ORAL | 0 refills | Status: AC

## 2022-01-06 NOTE — ED Triage Notes (Signed)
Pt states right sided dental swelling x3 days. Pt states he tried to contact dentist but cannot get in for several days.

## 2022-01-06 NOTE — Discharge Instructions (Signed)
Follow-up with your dentist the next couple weeks

## 2022-01-06 NOTE — ED Provider Notes (Signed)
[facility] EMERGENCY DEPARTMENT Provider Note   CSN: [id number] Arrival date & time: 01/06/22  1025     History  Chief Complaint  Patient presents with   Dental Pain    Eddie Simpson is a 51 y.o. male.  Patient complains of swelling tenderness to right side of his face.  He has a history of poor dentition.  The history is provided by the patient and medical records. No language interpreter was used.  Dental Pain Location:  Lower Quality:  Constant and dull Severity:  Moderate Onset quality:  Sudden Timing:  Constant Progression:  Worsening Chronicity:  New Context: abscess   Relieved by:  Nothing Worsened by:  Nothing Ineffective treatments:  None tried Associated symptoms: no congestion and no headaches        Home Medications Prior to Admission medications   Medication Sig Start Date End Date Taking? Authorizing Provider  HYDROcodone-acetaminophen (NORCO/VICODIN) 5-325 MG tablet Take 1 every 6 hours for pain not relieved by Motrin or Tylenol 01/06/22  Yes Bethann Berkshire, MD  penicillin v potassium (VEETID) 500 MG tablet Take 1 tablet (500 mg total) by mouth 4 (four) times daily for 7 days. 01/06/22 01/13/22 Yes Bethann Berkshire, MD  Aspirin-Salicylamide-Caffeine (BC HEADACHE POWDER PO) Take 1 Package by mouth every 6 (six) hours as needed. For headache     [provider]  chlorhexidine (PERIDEX) 0.12 % solution Use as directed 15 mLs in the mouth or throat 2 (two) times daily. Rinse and Spit. DO NOT SWALLOW. 10/21/21   Harlene Salts A, PA-C  diphenhydrAMINE (BENADRYL) 25 MG tablet Take 1 tablet (25 mg total) by mouth every 6 (six) hours as needed for itching. Patient not taking: Reported on 08/20/2017 11/11/14   Fayrene Helper, PA-C  docusate sodium (COLACE) 100 MG capsule Take 1 capsule (100 mg total) by mouth every 12 (twelve) hours. Patient not taking: Reported on 08/20/2017 11/21/16   Pricilla Loveless, MD  methocarbamol (ROBAXIN) 500 MG tablet Take 1 tablet (500  mg total) by mouth 2 (two) times daily. Patient not taking: Reported on 11/21/2016 12/27/15   Elson Areas, PA-C  predniSONE (DELTASONE) 20 MG tablet 3 tabs po daily x 3 days, then 2 tabs x 3 days, then 1.5 tabs x 3 days, then 1 tab x 3 days, then 0.5 tabs x 3 days Patient not taking: Reported on 11/21/2016 11/11/14   Fayrene Helper, PA-C      Allergies    Bee venom, Flexeril [cyclobenzaprine], and Ibuprofen    Review of Systems   Review of Systems  Constitutional:  Negative for appetite change and fatigue.  HENT:  Negative for congestion, ear discharge and sinus pressure.        Swelling to face and painful teeth  Eyes:  Negative for discharge.  Respiratory:  Negative for cough.   Cardiovascular:  Negative for chest pain.  Gastrointestinal:  Negative for abdominal pain and diarrhea.  Genitourinary:  Negative for frequency and hematuria.  Musculoskeletal:  Negative for back pain.  Skin:  Negative for rash.  Neurological:  Negative for seizures and headaches.  Psychiatric/Behavioral:  Negative for hallucinations.     Physical Exam Updated Vital Signs BP (!) 145/92 (BP Location: Right Arm)   Pulse 71   Temp 97.6 F (36.4 C) (Oral)   Resp 16   Ht 5\' 11"  (1.803 m)   Wt 102.5 kg   SpO2 99%   BMI 31.52 kg/m  Physical Exam Vitals and nursing note reviewed.  Constitutional:  Appearance: He is well-developed.  HENT:     Head: Normocephalic.     Mouth/Throat:     Comments: Patient is swelling to the right side of his face l and patient has tenderness right lower molar Eyes:     General: No scleral icterus.    Conjunctiva/sclera: Conjunctivae normal.  Neck:     Thyroid: No thyromegaly.  Cardiovascular:     Rate and Rhythm: Normal rate and regular rhythm.     Heart sounds: No murmur heard.    No friction rub. No gallop.  Pulmonary:     Breath sounds: No stridor. No wheezing or rales.  Chest:     Chest wall: No tenderness.  Abdominal:     General: There is no distension.      Tenderness: There is no abdominal tenderness. There is no rebound.  Musculoskeletal:        General: Normal range of motion.     Cervical back: Neck supple.  Lymphadenopathy:     Cervical: No cervical adenopathy.  Skin:    Findings: No erythema or rash.  Neurological:     Mental Status: He is alert and oriented to person, place, and time.     Motor: No abnormal muscle tone.     Coordination: Coordination normal.  Psychiatric:        Behavior: Behavior normal.     ED Results / Procedures / Treatments   Labs (all labs ordered are listed, but only abnormal results are displayed) Labs Reviewed - No data to display  EKG None  Radiology No results found.  Procedures Procedures    Medications Ordered in ED Medications - No data to display  ED Course/ Medical Decision Making/ A&P                           Medical Decision Making Risk Prescription drug management.  This patient presents to the ED for concern of toothache, this involves an extensive number of treatment options, and is a complaint that carries with it a high risk of complications and morbidity.  The differential diagnosis includes abscessed tooth, broken tooth   Co morbidities that complicate the patient evaluation  Orientation   Additional history obtained:  Additional history obtained from patient External records from outside source obtained and reviewed including hospital records   Lab Tests:  No labs  Imaging Studies ordered: No imaging Cardiac Monitoring: / EKG:  The patient was maintained on a cardiac monitor.  I personally viewed and interpreted the cardiac monitored which showed an underlying rhythm of: Normal sinus rhythm   Consultations Obtained: No consult Problem List / ED Course / Critical interventions / Medication management  Abscess to I ordered medication including Vicodin and penicillin Reevaluation of the patient after these medicines showed that the patient  stayed the same I have reviewed the patients home medicines and have made adjustments as needed   Social Determinants of Health:  None   Test / Admission - Considered:  None  Patient with dental abscess.  He is given penicillin and hydrocodone and will follow-up with his dentist        Final Clinical Impression(s) / ED Diagnoses Final diagnoses:  Pain, dental    Rx / DC Orders ED Discharge Orders          Ordered    penicillin v potassium (VEETID) 500 MG tablet  4 times daily        01/06/22 1136  HYDROcodone-acetaminophen (NORCO/VICODIN) 5-325 MG tablet        01/06/22 1136              Bethann Berkshire, MD 01/06/22 1749

## 2022-01-06 NOTE — ED Notes (Signed)
Swelling noted to right lower jaw line, multiple missing/broken teeth. Pt reports last took [facility] powder last night for pain.

## 2023-06-29 ENCOUNTER — Emergency Department (HOSPITAL_COMMUNITY): Payer: 59

## 2023-06-29 ENCOUNTER — Emergency Department (HOSPITAL_COMMUNITY)
Admission: EM | Admit: 2023-06-29 | Discharge: 2023-06-29 | Disposition: A | Discharge status: Home / Self Care | Payer: 59 | Attending: Emergency Medicine | Admitting: Emergency Medicine

## 2023-06-29 ENCOUNTER — Encounter (HOSPITAL_COMMUNITY): Payer: Self-pay

## 2023-06-29 ENCOUNTER — Other Ambulatory Visit (HOSPITAL_BASED_OUTPATIENT_CLINIC_OR_DEPARTMENT_OTHER): Payer: Self-pay

## 2023-06-29 DIAGNOSIS — Z20822 Contact with and (suspected) exposure to covid-19: Secondary | ICD-10-CM | POA: Diagnosis not present

## 2023-06-29 DIAGNOSIS — R0602 Shortness of breath: Secondary | ICD-10-CM | POA: Insufficient documentation

## 2023-06-29 DIAGNOSIS — I10 Essential (primary) hypertension: Secondary | ICD-10-CM | POA: Diagnosis not present

## 2023-06-29 DIAGNOSIS — Z7982 Long term (current) use of aspirin: Secondary | ICD-10-CM | POA: Diagnosis not present

## 2023-06-29 DIAGNOSIS — R079 Chest pain, unspecified: Secondary | ICD-10-CM | POA: Insufficient documentation

## 2023-06-29 LAB — RESP PANEL BY RT-PCR (RSV, FLU A&B, COVID)  RVPGX2
Influenza A by PCR: NEGATIVE
Influenza B by PCR: NEGATIVE
Resp Syncytial Virus by PCR: NEGATIVE
SARS Coronavirus 2 by RT PCR: NEGATIVE

## 2023-06-29 LAB — RAPID URINE DRUG SCREEN, HOSP PERFORMED
Amphetamines: NOT DETECTED
Barbiturates: NOT DETECTED
Benzodiazepines: NOT DETECTED
Cocaine: NOT DETECTED
Opiates: NOT DETECTED
Tetrahydrocannabinol: POSITIVE — AB

## 2023-06-29 LAB — BASIC METABOLIC PANEL
Anion gap: 13 (ref 5–15)
BUN: 12 mg/dL (ref 6–20)
CO2: 23 mmol/L (ref 22–32)
Calcium: 9.6 mg/dL (ref 8.9–10.3)
Chloride: 99 mmol/L (ref 98–111)
Creatinine, Ser: 0.91 mg/dL (ref 0.61–1.24)
GFR, Estimated: >60 mL/min (ref >60)
Glucose, Bld: 305 mg/dL — ABNORMAL HIGH (ref 70–99)
Potassium: 4.8 mmol/L (ref 3.5–5.1)
Sodium: 135 mmol/L (ref 135–145)

## 2023-06-29 LAB — HEPATIC FUNCTION PANEL
ALT: 14 U/L (ref 0–44)
AST: 11 U/L — ABNORMAL LOW (ref 15–41)
Albumin: 3.5 g/dL (ref 3.5–5.0)
Alkaline Phosphatase: 80 U/L (ref 38–126)
Bilirubin, Direct: <0.1 mg/dL (ref 0.0–0.2)
Total Bilirubin: 0.5 mg/dL (ref 0.0–1.2)
Total Protein: 6.2 g/dL — ABNORMAL LOW (ref 6.5–8.1)

## 2023-06-29 LAB — CBC
HCT: 43.7 % (ref 39.0–52.0)
Hemoglobin: 14.8 g/dL (ref 13.0–17.0)
MCH: 29.5 pg (ref 26.0–34.0)
MCHC: 33.9 g/dL (ref 30.0–36.0)
MCV: 87.2 fL (ref 80.0–100.0)
Platelets: 278 10*3/uL (ref 150–400)
RBC: 5.01 MIL/uL (ref 4.22–5.81)
RDW: 13.2 % (ref 11.5–15.5)
WBC: 13.3 10*3/uL — ABNORMAL HIGH (ref 4.0–10.5)
nRBC: 0 % (ref 0.0–0.2)

## 2023-06-29 LAB — LIPASE, BLOOD: Lipase: 41 U/L (ref 11–51)

## 2023-06-29 LAB — TROPONIN I (HIGH SENSITIVITY)
Troponin I (High Sensitivity): 4 ng/L (ref <18)
Troponin I (High Sensitivity): 3 ng/L (ref <18)

## 2023-06-29 MED ORDER — UNISOM SLEEPGELS 50 MG PO CAPS: 50.0000 mg | ORAL_CAPSULE | Freq: Every day | ORAL | 0 refills | Status: DC

## 2023-06-29 MED ORDER — MELATONIN 1 MG PO TABS: 1.0000 mg | ORAL_TABLET | Freq: Every day | ORAL | 0 refills | Status: DC

## 2023-06-29 MED ORDER — UNISOM SLEEPGELS 50 MG PO CAPS: 50.0000 mg | ORAL_CAPSULE | Freq: Every day | ORAL | 0 refills | Status: AC

## 2023-06-29 MED ORDER — UNISOM SLEEPGELS 50 MG PO CAPS
50.0000 mg | ORAL_CAPSULE | Freq: Every day | ORAL | 0 refills | Status: DC
  Filled 2023-06-29: qty 30, 30d supply, fill #0

## 2023-06-29 MED ORDER — ONDANSETRON HCL 4 MG/2ML IJ SOLN
4.0000 mg | Freq: Once | INTRAMUSCULAR | Status: AC
  Administered 2023-06-29: 4 mg via INTRAVENOUS
  Filled 2023-06-29: qty 2

## 2023-06-29 MED ORDER — MELATONIN 1 MG PO TABS
1.0000 mg | ORAL_TABLET | Freq: Every day | ORAL | 0 refills | Status: DC
  Filled 2023-06-29: qty 30, 30d supply, fill #0

## 2023-06-29 MED ORDER — KETOROLAC TROMETHAMINE 15 MG/ML IJ SOLN
15.0000 mg | Freq: Once | INTRAMUSCULAR | Status: AC
  Administered 2023-06-29: 15 mg via INTRAVENOUS
  Filled 2023-06-29: qty 1

## 2023-06-29 MED ORDER — MELATONIN 3 MG PO TABS: 3.0000 mg | ORAL_TABLET | Freq: Every day | ORAL | 0 refills | Status: AC

## 2023-06-29 NOTE — ED Provider Notes (Signed)
Accepted handoff at shift change from [facility], [place]. Please see prior provider note for more detail.   Briefly: Patient is 53 y.o. with history of hypertension anxiety opiate use presenting for trouble sleeping, not eating and chest pain which started yesterday evening.  DDX: concern for viral illness vs substance use vs ACS vs PNA vs pancreatitis vs anxiety vs biliary pathology   Plan: Awaiting second troponin.  If remaining workup is reassuring, can reassess and if feeling better likely discharge with PCP follow-up.    Physical Exam  BP (!) 128/95   Pulse 89   Temp 99.1 F (37.3 C) (Oral)   Resp 18   Ht 5\' 11"  (1.803 m)   Wt 102 kg   SpO2 100%   BMI 31.36 kg/m   Physical Exam  Procedures  Procedures  ED Course / MDM   Clinical Course as of 06/29/23 0646  Sat Jun 29, 2023  0554 Tolerating ginger ale. [KH]  1610 Vague non specific complaints. Poor appetite and not sleeping in the last few days. Having CP as well. Heroin use. No CAD history. Delta trop and likely dc.  [JR]    Clinical Course User Index [JR] Gareth Eagle, PA-C [KH] Antony Madura, PA-C   Medical Decision Making Amount and/or Complexity of Data Reviewed Labs: ordered. Radiology: ordered.  Risk Prescription drug management.   On reassessment, patient states he is feeling better and ready to be discharged. Troponins were flat. Discussed return precautions.  Advised to follow-up PCP.  Sent Unisom and melatonin to his pharmacy.  Discharged good condition.       Gareth Eagle, PA-C 06/29/23 0720    Nira Conn, MD 07/01/23 [phone]

## 2023-06-29 NOTE — ED Provider Notes (Signed)
Edd[facility] EMERGENCY DEPARTMENT AT [facility] Provider Note   CSN: [id number] Arrival date & time: 06/29/23  0406     History  Chief Complaint  Patient presents with   Chest Pain    Eddie Simpson is a 53 y.o. male.  53 y/o male with hx of HTN, anxiety, opiate use d/o presents to the ED for evaluation. States that he has had little sleep in the past 4 days and nothing to eat in the last 72 hours. Notes malaise with subjective chills, nausea. No sick contacts or documented fevers. Called EMS this evening due to development of associated chest pain. Chest pain now resolved. Given 324mg  ASA during transport. Had some SOB with chest pain tonight. Denies fever, syncope, leg swelling, hematemesis, melena, hematochezia. No hx of prior coronary intervention or catheterization.  The history is provided by the patient. No language interpreter was used.  Chest Pain Associated symptoms: fatigue, shortness of breath and vomiting        Home Medications Prior to Admission medications   Medication Sig Start Date End Date Taking? Authorizing Provider  Aspirin-Salicylamide-Caffeine (BC HEADACHE POWDER PO) Take 1 Package by mouth every 6 (six) hours as needed. For headache     [provider]  chlorhexidine (PERIDEX) 0.12 % solution Use as directed 15 mLs in the mouth or throat 2 (two) times daily. Rinse and Spit. DO NOT SWALLOW. 10/21/21   Harlene Salts A, PA-C  diphenhydrAMINE (BENADRYL) 25 MG tablet Take 1 tablet (25 mg total) by mouth every 6 (six) hours as needed for itching. Patient not taking: Reported on 08/20/2017 11/11/14   Fayrene Helper, PA-C  docusate sodium (COLACE) 100 MG capsule Take 1 capsule (100 mg total) by mouth every 12 (twelve) hours. Patient not taking: Reported on 08/20/2017 11/21/16   Pricilla Loveless, MD  HYDROcodone-acetaminophen (NORCO/VICODIN) 5-325 MG tablet Take 1 every 6 hours for pain not relieved by Motrin or Tylenol 01/06/22   Bethann Berkshire, MD   methocarbamol (ROBAXIN) 500 MG tablet Take 1 tablet (500 mg total) by mouth 2 (two) times daily. Patient not taking: Reported on 11/21/2016 12/27/15   Elson Areas, PA-C  predniSONE (DELTASONE) 20 MG tablet 3 tabs po daily x 3 days, then 2 tabs x 3 days, then 1.5 tabs x 3 days, then 1 tab x 3 days, then 0.5 tabs x 3 days Patient not taking: Reported on 11/21/2016 11/11/14   Fayrene Helper, PA-C      Allergies    Bee venom, Flexeril [cyclobenzaprine], and Ibuprofen    Review of Systems   Review of Systems  Constitutional:  Positive for appetite change and fatigue.  Respiratory:  Positive for shortness of breath.   Cardiovascular:  Positive for chest pain.  Gastrointestinal:  Positive for diarrhea and vomiting.  Psychiatric/Behavioral:  Positive for sleep disturbance.   Ten systems reviewed and are negative for acute change, except as noted in the HPI.    Physical Exam Updated Vital Signs BP (!) 142/107   Pulse (!) 104   Temp 99.1 F (37.3 C) (Oral)   Resp 20   SpO2 100%   Physical Exam Vitals and nursing note reviewed.  Constitutional:      General: He is not in acute distress.    Appearance: He is well-developed. He is not diaphoretic.     Comments: Nontoxic appearing and in NAD  HENT:     Head: Normocephalic and atraumatic.  Eyes:     General: No scleral icterus.  Conjunctiva/sclera: Conjunctivae normal.  Cardiovascular:     Rate and Rhythm: Regular rhythm. Tachycardia present.     Pulses: Normal pulses.  Pulmonary:     Effort: Pulmonary effort is normal. No respiratory distress.     Breath sounds: No stridor. No wheezing.     Comments: Respirations even and unlabored Musculoskeletal:        General: Normal range of motion.     Cervical back: Normal range of motion.  Skin:    General: Skin is warm and dry.     Coloration: Skin is not pale.     Findings: No erythema or rash.  Neurological:     Mental Status: He is alert and oriented to person, place, and time.      Coordination: Coordination normal.     Comments: Moving all extremities spontaneously  Psychiatric:        Behavior: Behavior normal.     ED Results / Procedures / Treatments   Labs (all labs ordered are listed, but only abnormal results are displayed) Labs Reviewed  BASIC METABOLIC PANEL - Abnormal; Notable for the following components:      Result Value   Glucose, Bld 305 (*)    All other components within normal limits  RESP PANEL BY RT-PCR (RSV, FLU A&B, COVID)  RVPGX2  CBC  RAPID URINE DRUG SCREEN, HOSP PERFORMED  HEPATIC FUNCTION PANEL  LIPASE, BLOOD  TROPONIN I (HIGH SENSITIVITY)  TROPONIN I (HIGH SENSITIVITY)    EKG EKG Interpretation Date/Time:  Saturday June 29 2023 04:14:37 EST Ventricular Rate:  99 PR Interval:  150 QRS Duration:  88 QT Interval:  346 QTC Calculation: 444 R Axis:   12  Text Interpretation: Normal sinus rhythm Minimal voltage criteria for LVH, may be normal variant ( R in aVL ) Borderline ECG When compared with ECG of 29-Aug-2009 20:42, PREVIOUS ECG IS PRESENT No acute changes Confirmed by Drema Pry [phone]) on 06/29/2023 5:55:36 AM  Radiology No results found.  Procedures Procedures    Medications Ordered in ED Medications  ondansetron (ZOFRAN) injection 4 mg (4 mg Intravenous Given 06/29/23 0510)  ketorolac (TORADOL) 15 MG/ML injection 15 mg (15 mg Intravenous Given 06/29/23 0510)    ED Course/ Medical Decision Making/ A&P Clinical Course as of 06/29/23 2130  Sat Jun 29, 2023  0554 Tolerating ginger ale. [KH]    Clinical Course User Index [KH] Antony Madura, PA-C                                 Medical Decision Making Amount and/or Complexity of Data Reviewed Labs: ordered. Radiology: ordered.  Risk Prescription drug management.   This patient presents to the ED for concern of multiple complaints including malaise, insomnia, chest pain, V/D, this involves an extensive number of treatment options, and is a complaint  that carries with it a high risk of complications and morbidity.  The differential diagnosis includes viral illness vs substance use vs ACS vs PNA vs pancreatitis vs anxiety vs biliary pathology.   Co morbidities that complicate the patient evaluation  HTN Anxiety   Additional history obtained:  Additional history obtained from EMS personnel External records from outside source obtained and reviewed including CXR in 2013 without acute cardiopulmonary abnormality.   Lab Tests:  I Ordered, and personally interpreted labs.  The pertinent results include:  Glucose 305 on BMP. Initial troponin negative. Remaining labs pending results at shift change.  Imaging Studies ordered:  I ordered imaging studies including CXR  I independently visualized and interpreted imaging which showed no acute cardiopulmonary abnormality. I agree with the radiologist interpretation   Cardiac Monitoring:  The patient was maintained on a cardiac monitor.  I personally viewed and interpreted the cardiac monitored which showed an underlying rhythm of: NSR, not tachycardic as noted in triage.   Medicines ordered and prescription drug management:  I ordered medication including Toradol for pain and Zofran for nausea  Reevaluation of the patient after these medicines showed that the patient improved I have reviewed the patients home medicines and have made adjustments as needed   Test Considered:  Ethanol   Reevaluation:  After the interventions noted above, I reevaluated the patient and found that they have : remained stable   Social Determinants of Health:  Polysubstance abuse   Dispostion:  Patient care signed out to J. Roxan Hockey, PA-C at change of shift pending laboratory evaluation, delta troponin, and symptomatic reassessment.         Final Clinical Impression(s) / ED Diagnoses Final diagnoses:  Nonspecific chest pain    Rx / DC Orders ED Discharge Orders     None          Antony Madura, PA-C 06/29/23 1610    Nira Conn, MD 07/01/23 [phone]

## 2023-06-29 NOTE — Discharge Instructions (Addendum)
Evaluation today was reassuring. I sent Unisom and melatonin to your pharmacy for your sleep.  Recommend you follow-up with PCP for your chest pain. If your symptoms worsen anyways please return to the emergency department for further evaluation.

## 2023-06-29 NOTE — ED Triage Notes (Signed)
Pt comes via [facility] for CP that has been going on for 3 days with SOB and some nausea.PTA received 324 ASA. 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

## 2023-06-29 NOTE — ED Notes (Signed)
AVS with prescriptions provided to and discussed with patient. Pt verbalizes understanding of discharge instructions and denies any questions or concerns at this time. Pt ambulated out of department independently with steady gait.

## 2023-07-10 ENCOUNTER — Telehealth: Payer: Self-pay

## 2023-07-10 NOTE — Progress Notes (Signed)
 Transition Care Management Unsuccessful Follow-up Telephone Call  Date of discharge and from where:  [facility]  Attempts:  1st Attempt  Reason for unsuccessful TCM follow-up call:  No answer/busy   Jon Colt [facility] Guide, Phone: [phone] Fax: [phone] Website: [email or web address]
